# Patient Record
Sex: Male | Born: 1945 | Race: White | Hispanic: No | Smoking: Current every day smoker
Health system: Southern US, Community
[De-identification: ages and names within clinical notes are randomized; demographics above are authoritative.]

## PROBLEM LIST (undated history)

## (undated) DIAGNOSIS — E559 Vitamin D deficiency, unspecified: Secondary | ICD-10-CM

## (undated) DIAGNOSIS — E785 Hyperlipidemia, unspecified: Secondary | ICD-10-CM

## (undated) DIAGNOSIS — E042 Nontoxic multinodular goiter: Secondary | ICD-10-CM

## (undated) DIAGNOSIS — E119 Type 2 diabetes mellitus without complications: Secondary | ICD-10-CM

## (undated) DIAGNOSIS — I2510 Atherosclerotic heart disease of native coronary artery without angina pectoris: Secondary | ICD-10-CM

## (undated) DIAGNOSIS — M109 Gout, unspecified: Secondary | ICD-10-CM

## (undated) DIAGNOSIS — M1990 Unspecified osteoarthritis, unspecified site: Secondary | ICD-10-CM

## (undated) DIAGNOSIS — I10 Essential (primary) hypertension: Secondary | ICD-10-CM

## (undated) DIAGNOSIS — M5416 Radiculopathy, lumbar region: Secondary | ICD-10-CM

## (undated) DIAGNOSIS — L720 Epidermal cyst: Secondary | ICD-10-CM

## (undated) HISTORY — PX: OTHER SURGICAL HISTORY: SHX169

## (undated) HISTORY — PX: TONSILLECTOMY: SUR1361

## (undated) HISTORY — PX: LUMBAR DISC SURGERY: SHX700

## (undated) HISTORY — PX: CARDIAC CATHETERIZATION: SHX172

---

## 1993-06-23 HISTORY — PX: HEMORRHOID SURGERY: SHX153

## 1997-06-23 HISTORY — PX: CORONARY ARTERY BYPASS GRAFT: SHX141

## 1998-05-23 DIAGNOSIS — I219 Acute myocardial infarction, unspecified: Secondary | ICD-10-CM

## 1998-05-23 HISTORY — DX: Acute myocardial infarction, unspecified: I21.9

## 2008-06-30 ENCOUNTER — Ambulatory Visit: Payer: Self-pay | Admitting: Internal Medicine

## 2009-11-12 ENCOUNTER — Ambulatory Visit: Payer: Self-pay | Admitting: Internal Medicine

## 2010-12-07 ENCOUNTER — Inpatient Hospital Stay: Payer: Self-pay | Admitting: Internal Medicine

## 2012-02-25 ENCOUNTER — Ambulatory Visit: Payer: Self-pay | Admitting: Otolaryngology

## 2012-09-29 ENCOUNTER — Ambulatory Visit: Payer: Self-pay | Admitting: Otolaryngology

## 2013-12-01 ENCOUNTER — Ambulatory Visit: Payer: Self-pay | Admitting: Otolaryngology

## 2014-08-25 ENCOUNTER — Encounter: Payer: Self-pay | Admitting: General Surgery

## 2014-08-30 ENCOUNTER — Ambulatory Visit: Payer: Self-pay | Admitting: General Surgery

## 2017-02-14 ENCOUNTER — Encounter: Payer: Self-pay | Admitting: Emergency Medicine

## 2017-02-14 ENCOUNTER — Emergency Department
Admission: EM | Admit: 2017-02-14 | Discharge: 2017-02-14 | Disposition: A | Discharge status: Home / Self Care | Payer: Medicare Other | Attending: Emergency Medicine | Admitting: Emergency Medicine

## 2017-02-14 DIAGNOSIS — I10 Essential (primary) hypertension: Secondary | ICD-10-CM | POA: Diagnosis not present

## 2017-02-14 DIAGNOSIS — F17210 Nicotine dependence, cigarettes, uncomplicated: Secondary | ICD-10-CM | POA: Insufficient documentation

## 2017-02-14 DIAGNOSIS — M5442 Lumbago with sciatica, left side: Secondary | ICD-10-CM | POA: Insufficient documentation

## 2017-02-14 DIAGNOSIS — M545 Low back pain: Secondary | ICD-10-CM | POA: Diagnosis present

## 2017-02-14 HISTORY — DX: Essential (primary) hypertension: I10

## 2017-02-14 MED ORDER — KETOROLAC TROMETHAMINE 30 MG/ML IJ SOLN
30.0000 mg | Freq: Once | INTRAMUSCULAR | Status: AC
  Administered 2017-02-14: 30 mg via INTRAMUSCULAR
  Filled 2017-02-14: qty 1

## 2017-02-14 MED ORDER — PREDNISONE 50 MG PO TABS: ORAL_TABLET | ORAL | 0 refills | Status: DC

## 2017-02-14 MED ORDER — CYCLOBENZAPRINE HCL 5 MG PO TABS: 5.0000 mg | ORAL_TABLET | Freq: Three times a day (TID) | ORAL | 0 refills | Status: AC | PRN

## 2017-02-14 NOTE — ED Notes (Signed)
Discussed discharge instructions, prescriptions, and follow-up care with patient. No questions or concerns at this time. Pt stable at discharge.  

## 2017-02-14 NOTE — ED Provider Notes (Signed)
[facility] Emergency Department Provider Note  ____________________________________________  Time seen: Approximately 3:41 PM  I have reviewed the triage vital signs and the nursing notes.   HISTORY  Chief Complaint Back Pain    HPI Keith Bradley is a 71 y.o. male presents to the emergency department with 9/10 low back pain with left lower extremity radiculopathy for the past 4 days. Patient states that radiculopathy stops at the posterior knee. His pain is worsened with standing and with ambulation and relieved with a sitting position. Patient denies heavy lifting, falls or other incidences of trauma. Patient states that he had similar symptoms approximately 3 years ago which was alleviated with Flexeril and Celebrex. Patient denies changes in sensation or bowel or bladder incontinence.   Past Medical History:  Diagnosis Date  . Hypertension     There are no active problems to display for this patient.   Past Surgical History:  Procedure Laterality Date  . cardiac bypass      Prior to Admission medications   Medication Sig Start Date End Date Taking? Authorizing Provider  cyclobenzaprine (FLEXERIL) 5 MG tablet Take 1 tablet (5 mg total) by mouth 3 (three) times daily as needed for muscle spasms. 02/14/17 02/17/17  Lannie Fields, PA-C  predniSONE (DELTASONE) 50 MG tablet Take 1 tablet by mouth for the next 5 days. 02/14/17   Lannie Fields, PA-C    Allergies Ace inhibitors  No family history on file.  Social History Social History  Substance Use Topics  . Smoking status: Current Every Day Smoker    Packs/day: 0.75    Types: Cigarettes  . Smokeless tobacco: Not on file  . Alcohol use Not on file     Review of Systems  Constitutional: No fever/chills Eyes: No visual changes. No discharge ENT: No upper respiratory complaints. Cardiovascular: no chest pain. Respiratory: no cough. No SOB. Musculoskeletal:Patient has low back pain with left  lower extremity radiculopathy. Skin: Negative for rash, abrasions, lacerations, ecchymosis. Neurological: Negative for headaches, focal weakness or numbness.   ____________________________________________   PHYSICAL EXAM:  VITAL SIGNS: ED Triage Vitals  Enc Vitals Group     BP 02/14/17 1440 (!) 156/70     Pulse Rate 02/14/17 1440 (!) 58     Resp 02/14/17 1440 18     Temp 02/14/17 1440 97.7 F (36.5 C)     Temp Source 02/14/17 1440 Oral     SpO2 02/14/17 1440 96 %     Weight 02/14/17 1440 165 lb (74.8 kg)     Height 02/14/17 1440 5\' 9"  (1.753 m)     Head Circumference --      Peak Flow --      Pain Score 02/14/17 1439 9     Pain Loc --      Pain Edu? --      Excl. in [place]? --      Constitutional: Alert and oriented. Well appearing and in no acute distress. Eyes: Conjunctivae are normal. PERRL. EOMI. Head: Atraumatic. Cardiovascular: Normal rate, regular rhythm. Normal S1 and S2.  Good peripheral circulation. Respiratory: Normal respiratory effort without tachypnea or retractions. Lungs CTAB. Good air entry to the bases with no decreased or absent breath sounds. Musculoskeletal:Patient has 5/5 strength in the upper and lower extremities bilaterally. Full range of motion at the shoulder, elbow and wrist bilaterally. Full range of motion at the hip, knee and ankle bilaterally. Radiculopathy is elicited with standing position. No changes in gait.   Neurologic:  Normal speech and language. No gross focal neurologic deficits are appreciated.  Skin:  Skin is warm, dry and intact. No rash noted. Psychiatric: Mood and affect are normal. Speech and behavior are normal. Patient exhibits appropriate insight and judgement.   ____________________________________________   LABS (all labs ordered are listed, but only abnormal results are displayed)  Labs Reviewed - No data to  display ____________________________________________  EKG   ____________________________________________  RADIOLOGY   No results found.  ____________________________________________    PROCEDURES  Procedure(s) performed:    Procedures    Medications  ketorolac (TORADOL) 30 MG/ML injection 30 mg (not administered)     ____________________________________________   INITIAL IMPRESSION / ASSESSMENT AND PLAN / ED COURSE  Pertinent labs & imaging results that were available during my care of the patient were reviewed by me and considered in my medical decision making (see chart for details).  Review of the [place] CSRS was performed in accordance of the [place] prior to dispensing any controlled drugs.     Assessment and plan Low back pain with left lower extremity radiculopathy Patient presents to the emergency department with low back pain with left lower extremity radiculopathy. Neurologic exam and overall physical exam was reassuring. Toradol was given in the emergency department. Patient was discharged with Flexeril and prednisone. Patient was advised to only start prednisone if radiculopathy is refractory to Toradol. Patient voiced understanding regarding this recommendation. He was referred to neurosurgery, Dr. Cari Caraway. Patient was advised to make a follow-up appointment if low back pain with left lower extremity radiculopathy becomes a chronic issue for him.   ____________________________________________  FINAL CLINICAL IMPRESSION(S) / ED DIAGNOSES  Final diagnoses:  Acute bilateral low back pain with left-sided sciatica      NEW MEDICATIONS STARTED DURING THIS VISIT:  New Prescriptions   CYCLOBENZAPRINE (FLEXERIL) 5 MG TABLET    Take 1 tablet (5 mg total) by mouth 3 (three) times daily as needed for muscle spasms.   PREDNISONE (DELTASONE) 50 MG TABLET    Take 1 tablet by mouth for the next 5 days.        This chart was dictated using voice  recognition software/Dragon. Despite best efforts to proofread, errors can occur which can change the meaning. Any change was purely unintentional.    Lannie Fields, PA-C 02/14/17 1550    Hinda Kehr, MD 02/14/17 2022

## 2017-02-14 NOTE — ED Triage Notes (Signed)
States low back pain radiating to L leg x 5 days. No injury at onset.

## 2017-05-11 ENCOUNTER — Other Ambulatory Visit: Payer: Self-pay | Admitting: Internal Medicine

## 2017-05-11 DIAGNOSIS — M51369 Other intervertebral disc degeneration, lumbar region without mention of lumbar back pain or lower extremity pain: Secondary | ICD-10-CM

## 2017-05-11 DIAGNOSIS — M5136 Other intervertebral disc degeneration, lumbar region: Secondary | ICD-10-CM

## 2017-05-13 ENCOUNTER — Other Ambulatory Visit: Payer: Self-pay | Admitting: Internal Medicine

## 2017-05-13 DIAGNOSIS — N189 Chronic kidney disease, unspecified: Secondary | ICD-10-CM

## 2017-05-20 ENCOUNTER — Ambulatory Visit
Admission: RE | Admit: 2017-05-20 | Discharge: 2017-05-20 | Disposition: A | Discharge status: Home / Self Care | Payer: Medicare Other | Source: Ambulatory Visit | Attending: Internal Medicine | Admitting: Internal Medicine

## 2017-05-20 DIAGNOSIS — N200 Calculus of kidney: Secondary | ICD-10-CM | POA: Insufficient documentation

## 2017-05-20 DIAGNOSIS — N189 Chronic kidney disease, unspecified: Secondary | ICD-10-CM

## 2017-06-08 ENCOUNTER — Ambulatory Visit
Admission: RE | Admit: 2017-06-08 | Discharge: 2017-06-08 | Disposition: A | Discharge status: Home / Self Care | Payer: Medicare Other | Source: Ambulatory Visit | Attending: Internal Medicine | Admitting: Internal Medicine

## 2017-06-08 DIAGNOSIS — M5136 Other intervertebral disc degeneration, lumbar region: Secondary | ICD-10-CM | POA: Diagnosis present

## 2017-06-08 DIAGNOSIS — M48061 Spinal stenosis, lumbar region without neurogenic claudication: Secondary | ICD-10-CM | POA: Diagnosis not present

## 2017-06-08 DIAGNOSIS — M4317 Spondylolisthesis, lumbosacral region: Secondary | ICD-10-CM | POA: Diagnosis not present

## 2017-06-08 DIAGNOSIS — M5126 Other intervertebral disc displacement, lumbar region: Secondary | ICD-10-CM | POA: Insufficient documentation

## 2017-06-08 MED ORDER — GADOBENATE DIMEGLUMINE 529 MG/ML IV SOLN
15.0000 mL | Freq: Once | INTRAVENOUS | Status: AC | PRN
  Administered 2017-06-08: 15 mL via INTRAVENOUS

## 2017-06-10 ENCOUNTER — Ambulatory Visit: Payer: Medicare Other

## 2017-06-23 DIAGNOSIS — N189 Chronic kidney disease, unspecified: Secondary | ICD-10-CM

## 2017-06-23 HISTORY — DX: Chronic kidney disease, unspecified: N18.9

## 2017-07-07 ENCOUNTER — Observation Stay
Admission: EM | Admit: 2017-07-07 | Discharge: 2017-07-09 | Disposition: A | Discharge status: Home / Self Care | Payer: Medicare Other | Attending: Internal Medicine | Admitting: Internal Medicine

## 2017-07-07 ENCOUNTER — Emergency Department: Payer: Medicare Other

## 2017-07-07 ENCOUNTER — Encounter: Payer: Self-pay | Admitting: Intensive Care

## 2017-07-07 DIAGNOSIS — Z7902 Long term (current) use of antithrombotics/antiplatelets: Secondary | ICD-10-CM | POA: Diagnosis not present

## 2017-07-07 DIAGNOSIS — Z951 Presence of aortocoronary bypass graft: Secondary | ICD-10-CM | POA: Insufficient documentation

## 2017-07-07 DIAGNOSIS — Z888 Allergy status to other drugs, medicaments and biological substances status: Secondary | ICD-10-CM | POA: Insufficient documentation

## 2017-07-07 DIAGNOSIS — G8929 Other chronic pain: Principal | ICD-10-CM | POA: Insufficient documentation

## 2017-07-07 DIAGNOSIS — M5459 Other low back pain: Secondary | ICD-10-CM

## 2017-07-07 DIAGNOSIS — M5442 Lumbago with sciatica, left side: Secondary | ICD-10-CM | POA: Insufficient documentation

## 2017-07-07 DIAGNOSIS — F17210 Nicotine dependence, cigarettes, uncomplicated: Secondary | ICD-10-CM | POA: Insufficient documentation

## 2017-07-07 DIAGNOSIS — M549 Dorsalgia, unspecified: Secondary | ICD-10-CM | POA: Diagnosis present

## 2017-07-07 DIAGNOSIS — E781 Pure hyperglyceridemia: Secondary | ICD-10-CM | POA: Diagnosis not present

## 2017-07-07 DIAGNOSIS — I10 Essential (primary) hypertension: Secondary | ICD-10-CM | POA: Insufficient documentation

## 2017-07-07 DIAGNOSIS — M5116 Intervertebral disc disorders with radiculopathy, lumbar region: Secondary | ICD-10-CM | POA: Insufficient documentation

## 2017-07-07 DIAGNOSIS — I2510 Atherosclerotic heart disease of native coronary artery without angina pectoris: Secondary | ICD-10-CM | POA: Diagnosis not present

## 2017-07-07 DIAGNOSIS — M545 Low back pain: Secondary | ICD-10-CM | POA: Diagnosis present

## 2017-07-07 DIAGNOSIS — Z7982 Long term (current) use of aspirin: Secondary | ICD-10-CM | POA: Diagnosis not present

## 2017-07-07 LAB — CBC
HEMATOCRIT: 51 % (ref 40.0–52.0)
HEMOGLOBIN: 17.6 g/dL (ref 13.0–18.0)
MCH: 30.4 pg (ref 26.0–34.0)
MCHC: 34.5 g/dL (ref 32.0–36.0)
MCV: 88.1 fL (ref 80.0–100.0)
Platelets: 272 10*3/uL (ref 150–440)
RBC: 5.79 MIL/uL (ref 4.40–5.90)
RDW: 14.6 % — ABNORMAL HIGH (ref 11.5–14.5)
WBC: 9.7 10*3/uL (ref 3.8–10.6)

## 2017-07-07 LAB — CREATININE, SERUM
CREATININE: 1.22 mg/dL (ref 0.61–1.24)
GFR, EST NON AFRICAN AMERICAN: 58 mL/min — AB (ref >60)

## 2017-07-07 MED ORDER — HYDROMORPHONE HCL 1 MG/ML IJ SOLN
0.5000 mg | Freq: Once | INTRAMUSCULAR | Status: AC
  Administered 2017-07-07: 0.5 mg via INTRAVENOUS
  Filled 2017-07-07: qty 1

## 2017-07-07 MED ORDER — AMLODIPINE BESYLATE 5 MG PO TABS
5.0000 mg | ORAL_TABLET | Freq: Every day | ORAL | Status: DC
  Administered 2017-07-08 – 2017-07-09 (×2): 5 mg via ORAL
  Filled 2017-07-07 (×2): qty 1

## 2017-07-07 MED ORDER — FENOFIBRATE 160 MG PO TABS
160.0000 mg | ORAL_TABLET | Freq: Every day | ORAL | Status: DC
  Administered 2017-07-08 – 2017-07-09 (×2): 160 mg via ORAL
  Filled 2017-07-07 (×2): qty 1

## 2017-07-07 MED ORDER — KETOROLAC TROMETHAMINE 15 MG/ML IJ SOLN
15.0000 mg | Freq: Three times a day (TID) | INTRAMUSCULAR | Status: DC | PRN
  Filled 2017-07-07: qty 1

## 2017-07-07 MED ORDER — ONDANSETRON HCL 4 MG/2ML IJ SOLN: 4.0000 mg | Freq: Four times a day (QID) | INTRAMUSCULAR | Status: DC | PRN

## 2017-07-07 MED ORDER — HYDROMORPHONE HCL 1 MG/ML IJ SOLN: 1.0000 mg | INTRAMUSCULAR | Status: DC | PRN

## 2017-07-07 MED ORDER — ASPIRIN EC 81 MG PO TBEC
81.0000 mg | DELAYED_RELEASE_TABLET | Freq: Every day | ORAL | Status: DC
  Administered 2017-07-08 – 2017-07-09 (×2): 81 mg via ORAL
  Filled 2017-07-07 (×2): qty 1

## 2017-07-07 MED ORDER — PREDNISONE 20 MG PO TABS
60.0000 mg | ORAL_TABLET | Freq: Once | ORAL | Status: AC
  Administered 2017-07-07: 60 mg via ORAL
  Filled 2017-07-07: qty 3

## 2017-07-07 MED ORDER — HYDROMORPHONE HCL 1 MG/ML IJ SOLN
0.5000 mg | Freq: Once | INTRAMUSCULAR | Status: AC
  Administered 2017-07-07: 0.5 mg via INTRAVENOUS

## 2017-07-07 MED ORDER — ACETAMINOPHEN 650 MG RE SUPP: 650.0000 mg | Freq: Four times a day (QID) | RECTAL | Status: DC | PRN

## 2017-07-07 MED ORDER — ONDANSETRON HCL 4 MG PO TABS: 4.0000 mg | ORAL_TABLET | Freq: Four times a day (QID) | ORAL | Status: DC | PRN

## 2017-07-07 MED ORDER — METOPROLOL TARTRATE 50 MG PO TABS
100.0000 mg | ORAL_TABLET | Freq: Two times a day (BID) | ORAL | Status: DC
  Administered 2017-07-08 – 2017-07-09 (×3): 100 mg via ORAL
  Filled 2017-07-07 (×4): qty 2

## 2017-07-07 MED ORDER — CLOPIDOGREL BISULFATE 75 MG PO TABS
75.0000 mg | ORAL_TABLET | Freq: Every day | ORAL | Status: DC
  Administered 2017-07-08: 75 mg via ORAL
  Filled 2017-07-07: qty 1

## 2017-07-07 MED ORDER — ONDANSETRON HCL 4 MG/2ML IJ SOLN
4.0000 mg | Freq: Once | INTRAMUSCULAR | Status: AC
  Administered 2017-07-07: 4 mg via INTRAVENOUS
  Filled 2017-07-07: qty 2

## 2017-07-07 MED ORDER — SENNA 8.6 MG PO TABS
1.0000 | ORAL_TABLET | Freq: Two times a day (BID) | ORAL | Status: DC
  Administered 2017-07-07 – 2017-07-09 (×4): 8.6 mg via ORAL
  Filled 2017-07-07 (×4): qty 1

## 2017-07-07 MED ORDER — ACETAMINOPHEN 325 MG PO TABS: 650.0000 mg | ORAL_TABLET | Freq: Four times a day (QID) | ORAL | Status: DC | PRN

## 2017-07-07 MED ORDER — POLYETHYLENE GLYCOL 3350 17 G PO PACK: 17.0000 g | PACK | Freq: Every day | ORAL | Status: DC | PRN

## 2017-07-07 MED ORDER — ENOXAPARIN SODIUM 40 MG/0.4ML ~~LOC~~ SOLN
40.0000 mg | SUBCUTANEOUS | Status: DC
  Filled 2017-07-07: qty 0.4

## 2017-07-07 MED ORDER — HYDROMORPHONE HCL 1 MG/ML IJ SOLN
1.0000 mg | INTRAMUSCULAR | Status: AC
  Administered 2017-07-07: 1 mg via INTRAVENOUS
  Filled 2017-07-07: qty 1

## 2017-07-07 MED ORDER — LOSARTAN POTASSIUM 25 MG PO TABS
25.0000 mg | ORAL_TABLET | Freq: Every day | ORAL | Status: DC
  Administered 2017-07-08 – 2017-07-09 (×2): 25 mg via ORAL
  Filled 2017-07-07 (×2): qty 1

## 2017-07-07 MED ORDER — OXYCODONE-ACETAMINOPHEN 5-325 MG PO TABS
1.0000 | ORAL_TABLET | Freq: Once | ORAL | Status: AC
  Administered 2017-07-07: 1 via ORAL
  Filled 2017-07-07: qty 1

## 2017-07-07 MED ORDER — GABAPENTIN 100 MG PO CAPS
100.0000 mg | ORAL_CAPSULE | Freq: Three times a day (TID) | ORAL | Status: DC
  Administered 2017-07-08 – 2017-07-09 (×4): 100 mg via ORAL
  Filled 2017-07-07 (×5): qty 1

## 2017-07-07 MED ORDER — OXYCODONE HCL 5 MG PO TABS: 5.0000 mg | ORAL_TABLET | ORAL | Status: DC | PRN

## 2017-07-07 MED ORDER — MORPHINE SULFATE (PF) 4 MG/ML IV SOLN
6.0000 mg | Freq: Once | INTRAVENOUS | Status: AC
  Administered 2017-07-07: 6 mg via INTRAVENOUS
  Filled 2017-07-07: qty 2

## 2017-07-07 NOTE — ED Notes (Signed)
Pt was given water at this time.

## 2017-07-07 NOTE — H&P (Signed)
[facility] [place] NAME: Keith Bradley    MR#:  [id number]  DATE OF BIRTH:  [birth year]  DATE OF ADMISSION:  07/07/2017  PRIMARY CARE PHYSICIAN: Glendon Axe, MD   REQUESTING/REFERRING PHYSICIAN: Dr. Jacqualine Code  CHIEF COMPLAINT:   Severe back pain radiating to the left leg HISTORY OF PRESENT ILLNESS:  Keith Bradley  is a 72 y.o. 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  Patient says he has been on and off having back pain and recently seen a chiropractor a few times which helped however the last 2 times he tried to reach down to put things in the cabinet and jabbed his back requiring him now to come to the emergency room given radiation to the left side.  Denies any bowel or urinary incontinence He got couple rounds of IV pain medication.  ER physician spoke with Dr. Cari Caraway neurosurgery recommends patient be admitted for further evaluation and management.  PAST MEDICAL HISTORY:   Past Medical History:  Diagnosis Date  . Hypertension     PAST SURGICAL HISTOIRY:   Past Surgical History:  Procedure Laterality Date  . cardiac bypass      SOCIAL HISTORY:   Social History   Tobacco Use  . Smoking status: Current Every Day Smoker    Packs/day: 0.75    Types: Cigarettes  . Smokeless tobacco: Never Used  Substance Use Topics  . Alcohol use: No    Frequency: Never    FAMILY HISTORY:  History reviewed. No pertinent family history.  DRUG ALLERGIES:   Allergies  Allergen Reactions  . Ace Inhibitors Swelling    REVIEW OF SYSTEMS:  Review of Systems  Constitutional: Negative for chills, fever and weight loss.  HENT: Negative for  ear discharge, ear pain and nosebleeds.   Eyes: Negative for blurred vision, pain and discharge.  Respiratory: Negative for sputum production, shortness of breath, wheezing and stridor.   Cardiovascular: Negative for chest pain, palpitations, orthopnea and PND.  Gastrointestinal: Negative for abdominal pain, diarrhea, nausea and vomiting.  Genitourinary: Negative for frequency and urgency.  Musculoskeletal: Positive for back pain. Negative for joint pain.  Neurological: Positive for weakness. Negative for sensory change, speech change and focal weakness.  Psychiatric/Behavioral: Negative for depression and hallucinations. The patient is not nervous/anxious.      MEDICATIONS AT HOME:   Prior to Admission medications   Medication Sig Start Date End Date Taking? Authorizing Provider  amLODipine (NORVASC) 5 MG tablet Take 5 mg by mouth daily.   Yes [provider]  aspirin EC 81 MG tablet Take 81 mg by mouth daily.   Yes [provider]  clopidogrel (PLAVIX) 75 MG tablet Take 75 mg by mouth daily.   Yes [provider]  Evolocumab (REPATHA) 140 MG/ML SOSY Inject 140 mg into the skin every 14 (fourteen) days.   Yes [provider]  fenofibrate 160 MG tablet Take 160 mg by mouth daily.   Yes [provider]  gabapentin (NEURONTIN) 100 MG capsule Take 100 mg by mouth 3 (three) times daily.   Yes [provider]  losartan (COZAAR) 25 MG tablet Take 25 mg  by mouth daily.   Yes [provider]  metoprolol tartrate (LOPRESSOR) 100 MG tablet Take 100 mg by mouth 2 (two) times daily.   Yes [provider]      VITAL SIGNS:  Blood pressure (!) 136/59, pulse (!) 57, temperature 97.7 F (36.5 C), temperature source Oral, resp. rate 16, SpO2 93 %.  PHYSICAL EXAMINATION:  GENERAL:  72 y.o.-year-old patient lying in the bed with no acute distress.  EYES: Pupils equal, round, reactive to light and accommodation. No scleral icterus.  Extraocular muscles intact.  HEENT: Head atraumatic, normocephalic. Oropharynx and nasopharynx clear.  NECK:  Supple, no jugular venous distention. No thyroid enlargement, no tenderness.  LUNGS: Normal breath sounds bilaterally, no wheezing, rales,rhonchi or crepitation. No use of accessory muscles of respiration.  CARDIOVASCULAR: S1, S2 normal. No murmurs, rubs, or gallops.  ABDOMEN: Soft, nontender, nondistended. Bowel sounds present. No organomegaly or mass.  EXTREMITIES: No pedal edema, cyanosis, or clubbing.  NEUROLOGIC: Cranial nerves II through XII are intact. Muscle strength 5/5 in all extremities. Sensation intact. Gait not checked.  PSYCHIATRIC: The patient is alert and oriented x 3.  SKIN: No obvious rash, lesion, or ulcer.   LABORATORY PANEL:   CBC No results for input(s): WBC, HGB, HCT, PLT in the last 168 hours. ------------------------------------------------------------------------------------------------------------------  Chemistries  No results for input(s): NA, K, CL, CO2, GLUCOSE, BUN, CREATININE, CALCIUM, MG, AST, ALT, ALKPHOS, BILITOT in the last 168 hours.  Invalid input(s): GFRCGP ------------------------------------------------------------------------------------------------------------------  Cardiac Enzymes No results for input(s): TROPONINI in the last 168 hours. ------------------------------------------------------------------------------------------------------------------  RADIOLOGY:  Mr Lumbar Spine Wo Contrast  Result Date: 07/07/2017 CLINICAL DATA:  Low back pain radiating to the left lower extremity EXAM: MRI LUMBAR SPINE WITHOUT CONTRAST TECHNIQUE: Multiplanar, multisequence MR imaging of the lumbar spine was performed. No intravenous contrast was administered. COMPARISON:  Lumbar spine MRI 06/08/2017 FINDINGS: Segmentation:  Standard. Alignment:  Physiologic. Vertebrae: Heterogeneous bone marrow signal, unchanged. No acute fracture or  discitis-osteomyelitis. Conus medullaris and cauda equina: Conus extends to the L1 level. Conus and cauda equina appear normal. Paraspinal and other soft tissues: Normal Disc levels: T11-T12: Evaluated on sagittal images only.  No stenosis. T12-L1: Normal disc space and facets. No spinal canal or neuroforaminal stenosis. L1-L2: Normal disc space and facets. No spinal canal or neuroforaminal stenosis. L2-L3: Disc desiccation and mild diffuse bulge. No spinal canal stenosis. Mild bilateral foraminal stenosis. L3-L4: Left eccentric disc bulge, greatest in the left extraforaminal zone, unchanged. Mild right and moderate left foraminal stenosis. No spinal canal stenosis. Normal facets. L4-L5: Disc desiccation with left eccentric bulge and extraforaminal left annular fissure. No spinal canal stenosis or neural foraminal stenosis. The left extraforaminal annular fissure is in close proximity to the exiting L4 nerve root, but unchanged from the prior study. Moderate left facet hypertrophy. L5-S1: Bilobed subarticular/foraminal disc protrusions with moderate, unchanged bilateral neural foraminal stenosis. No spinal canal stenosis. Visualized sacrum: Normal. IMPRESSION: 1. L3-L4 and L4-L5 left eccentric disc bulges that are greatest in the extraforaminal zone and are in close proximity to the exiting left L3 and L4 nerve roots, but are unchanged compared to 06/08/2017. Moderate left L3 foraminal stenosis. 2. L5-S1 moderate bilateral foraminal stenosis. Electronically Signed   By: Ulyses Jarred M.D.   On: 07/07/2017 15:25    EKG:    IMPRESSION AND PLAN:   Keith Bradley  is a 72 y.o. 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  1.  Acute on chronic low back pain -MRI indicated of [id number] disc bulges with left L3 foraminal stenosis -Admit for pain control and neurosurgery eval -IV Dilaudid as needed with Tylenol and oxycodone -Continue Neurontin -Received 1 dose of prednisone in the ER.  I will hold off until neuro evaluation is done to see if patient needs a course of prednisone -Therapy consultation once seen by neurosurgery  2.  Hypertension continue losartan and metoprolol  3.  CAD status post CABG -Continue cardiac meds and aspirin Plavix  4.  DVT prophylaxis subcu Lovenox    All the records are reviewed and case discussed with ED provider. Management plans discussed with the patient, family and they are in agreement.  CODE STATUS: full  TOTAL TIME TAKING CARE OF THIS PATIENT: *50* minutes.    [name] M.D on 07/07/2017 at 6:44 PM  Between 7am to 6pm - Pager - [phone]  After 6pm go to [email or web address] - password [email or web address] [facility]  [facility]  Office  [phone]  CC: Primary care physician; Glendon Axe, MD

## 2017-07-07 NOTE — ED Notes (Signed)
Patient transported to MRI 

## 2017-07-07 NOTE — ED Provider Notes (Signed)
[facility] Emergency Department Provider Note  ____________________________________________   First MD Initiated Contact with Patient 07/07/17 1027     (approximate)  I have reviewed the triage vital signs and the nursing notes.   HISTORY  Chief Complaint Back Pain (lower)   HPI Keith Bradley is a 72 y.o. male with worsening back pain over the past month who is presenting to the emergency department today with left lower back pain radiating down his left leg.  He is denying any numbness, weakness or loss of bowel or bladder continence.  Michela Pitcher that he had a recent MRI which showed herniated disks.  He has been taking Tylenol as well as gabapentin without improvement.  He is presenting to the emergency department today because of worsening pain as well as not being able to do some activities anymore such as putting dishes and is covered as well as lifting grocery bags up his stairs.  He states his pain is an 8-10 out of 10 at this time and radiating down his left leg, laterally down to the knee.    Past Medical History:  Diagnosis Date  . Hypertension     There are no active problems to display for this patient.   Past Surgical History:  Procedure Laterality Date  . cardiac bypass      Prior to Admission medications   Medication Sig Start Date End Date Taking? Authorizing Provider  predniSONE (DELTASONE) 50 MG tablet Take 1 tablet by mouth for the next 5 days. 02/14/17   Lannie Fields, PA-C    Allergies Ace inhibitors  History reviewed. No pertinent family history.  Social History Social History   Tobacco Use  . Smoking status: Current Every Day Smoker    Packs/day: 0.75    Types: Cigarettes  . Smokeless tobacco: Never Used  Substance Use Topics  . Alcohol use: No    Frequency: Never  . Drug use: Not on file    Review of Systems  Constitutional: No fever/chills Eyes: No visual changes. ENT: No sore throat. Cardiovascular: Denies  chest pain. Respiratory: Denies shortness of breath. Gastrointestinal: No abdominal pain.  No nausea, no vomiting.  No diarrhea.  No constipation. Genitourinary: Negative for dysuria. Musculoskeletal: As above  skin: Negative for rash. Neurological: Negative for headaches, focal weakness or numbness.   ____________________________________________   PHYSICAL EXAM:  VITAL SIGNS: ED Triage Vitals [07/07/17 1024]  Enc Vitals Group     BP (!) 187/78     Pulse Rate (!) 56     Resp 16     Temp 97.9 F (36.6 C)     Temp Source Oral     SpO2 95 %     Weight      Height      Head Circumference      Peak Flow      Pain Score 7     Pain Loc      Pain Edu?      Excl. in [place]?     Constitutional: Alert and oriented. Well appearing and in no acute distress. Eyes: Conjunctivae are normal.  Head: Atraumatic. Nose: No congestion/rhinnorhea. Mouth/Throat: Mucous membranes are moist.  Neck: No stridor.   Cardiovascular: Normal rate, regular rhythm. Grossly normal heart sounds.  Good peripheral circulation with equal and bilateral dorsalis pedis pulses. Respiratory: Normal respiratory effort.  No retractions. Lungs CTAB. Gastrointestinal: Soft and nontender. No distention. No CVA tenderness. Musculoskeletal: No lower extremity tenderness nor edema.  No joint effusions.  5 out of 5 strength bilateral lower extreme knees.  No tenderness to palpation to the bilateral lower extreme is.  There is minimal tenderness palpation of the left low lumbar region.  No midline tender to the mid no saddle anesthesia.  5 out of 5 strength to dorsiflexion of the great toes bilaterally. Neurologic:  Normal speech and language. No gross focal neurologic deficits are appreciated. Skin:  Skin is warm, dry and intact. No rash noted. Psychiatric: Mood and affect are normal. Speech and behavior are normal.  ____________________________________________   LABS (all labs ordered are listed, but only abnormal results  are displayed)  Labs Reviewed - No data to display ____________________________________________  EKG   ____________________________________________  RADIOLOGY   ____________________________________________   PROCEDURES  Procedure(s) performed:   Procedures  Critical Care performed:   ____________________________________________   INITIAL IMPRESSION / ASSESSMENT AND PLAN / ED COURSE  Pertinent labs & imaging results that were available during my care of the patient were reviewed by me and considered in my medical decision making (see chart for details).  DDX: Radiculopathy, sciatica, chronic low back pain, vertebral compression fracture, cauda equina As part of my medical decision making, I reviewed the following data within the [place] reviewed from 1217 which shows protrusion in the far periphery of the left foramen at L3 and L5 which extends beyond the foramen and impinges on the exiting and exited left L3 root.  Moderate to severe foraminal stenosis.  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  We will progress his pain control and try with the Percocet as well as prednisone and reassess.   ----------------------------------------- 3:25 PM on 07/07/2017 -----------------------------------------  Patient without resolution of pain despite Percocet, prednisone, 8 mg of morphine and Dilaudid.  Pending MRI at this time.  Signed out to Dr. Jacqualine Code.  ____________________________________________   FINAL CLINICAL IMPRESSION(S) / ED DIAGNOSES  Low back pain with sciatica.    NEW MEDICATIONS STARTED DURING THIS VISIT:  New Prescriptions   No medications on file     Note:  This document was prepared using Dragon voice recognition software and may include unintentional dictation errors.     Orbie Pyo, MD 07/07/17  [phone]

## 2017-07-07 NOTE — ED Triage Notes (Signed)
Patient arrived from home by EMS for lower back pain that radiates down L leg. Pt states PCP [facility] told him he has sciatica and bulging disc in L3 and L1 out of line shown by MRI. A&O x4

## 2017-07-07 NOTE — Progress Notes (Signed)
Family Meeting Note  Advance Directive:yes  Today a meeting took place with the  Pt anf MD   The following were discussed:Patient's diagnosis: , Patient's progosis: Patient comes in with intractable back pain.  He has lumbar disc disease.  He has a history of CAD and hypertension.  Discuss CODE STATUS patient wants to be full code.   Time spent during discussion 16 mins [name], MD

## 2017-07-07 NOTE — ED Provider Notes (Signed)
Mr Lumbar Spine Wo Contrast  Result Date: 07/07/2017 CLINICAL DATA:  Low back pain radiating to the left lower extremity EXAM: MRI LUMBAR SPINE WITHOUT CONTRAST TECHNIQUE: Multiplanar, multisequence MR imaging of the lumbar spine was performed. No intravenous contrast was administered. COMPARISON:  Lumbar spine MRI 06/08/2017 FINDINGS: Segmentation:  Standard. Alignment:  Physiologic. Vertebrae: Heterogeneous bone marrow signal, unchanged. No acute fracture or discitis-osteomyelitis. Conus medullaris and cauda equina: Conus extends to the L1 level. Conus and cauda equina appear normal. Paraspinal and other soft tissues: Normal Disc levels: T11-T12: Evaluated on sagittal images only.  No stenosis. T12-L1: Normal disc space and facets. No spinal canal or neuroforaminal stenosis. L1-L2: Normal disc space and facets. No spinal canal or neuroforaminal stenosis. L2-L3: Disc desiccation and mild diffuse bulge. No spinal canal stenosis. Mild bilateral foraminal stenosis. L3-L4: Left eccentric disc bulge, greatest in the left extraforaminal zone, unchanged. Mild right and moderate left foraminal stenosis. No spinal canal stenosis. Normal facets. L4-L5: Disc desiccation with left eccentric bulge and extraforaminal left annular fissure. No spinal canal stenosis or neural foraminal stenosis. The left extraforaminal annular fissure is in close proximity to the exiting L4 nerve root, but unchanged from the prior study. Moderate left facet hypertrophy. L5-S1: Bilobed subarticular/foraminal disc protrusions with moderate, unchanged bilateral neural foraminal stenosis. No spinal canal stenosis. Visualized sacrum: Normal. IMPRESSION: 1. L3-L4 and L4-L5 left eccentric disc bulges that are greatest in the extraforaminal zone and are in close proximity to the exiting left L3 and L4 nerve roots, but are unchanged compared to 06/08/2017. Moderate left L3 foraminal stenosis. 2. L5-S1 moderate bilateral foraminal stenosis. Electronically  Signed   By: Ulyses Jarred M.D.   On: 07/07/2017 15:25    MRI reviewed, appears stable from previous.  Patient reports his pain is recurring and severe.  He is awake and alert and appears in pain, reports he cannot lay at all in his left side he is having severe radiating pain in the left posterior leg.  We will give additional Dilaudid, as discussed with Dr. Cari Caraway of neurosurgery, he is happy to see the patient in consultation in the hospital and advising pain control.  I discussed with the patient, patient is agreeable and requesting admission to hospital for pain control.  At this point given the high doses of IV narcotics required to even ease his pain back some I think it is reasonable to admit him for neurosurgery consultation.  No evidence of acute cauda equina.  Plan consult with neurosurgery today.  Hospitalist to admit.  Discussed case with Dr. Lovett Sox, MD 07/07/17 [phone]

## 2017-07-07 NOTE — Progress Notes (Signed)
Patient admitted to room 138. Alert and oriented. Patient was able to roll himself from the stretcher to the bed unassisted. No complaints of numbness or tingling. Patient able to move lower extremities unassisted.

## 2017-07-07 NOTE — Consult Note (Signed)
Full consult note to follow.  72 yo male with L thigh pain and L3 greater than L4 compression on lumbar MRI scan.    - agree with prednisone and other methods for pain control - ideally I would recommend that he have L L3 and/or L4 nerve blocks by IR, but they may be hesitant to do so because of his history of being on plavix - He may improve with conservative measures including meds, physical therapy, and injections.  If he does not, surgical intervention could be considered.    Keith Bradley

## 2017-07-08 ENCOUNTER — Other Ambulatory Visit: Payer: Self-pay

## 2017-07-08 DIAGNOSIS — G8929 Other chronic pain: Secondary | ICD-10-CM | POA: Diagnosis not present

## 2017-07-08 MED ORDER — CLOPIDOGREL BISULFATE 75 MG PO TABS
75.0000 mg | ORAL_TABLET | Freq: Every day | ORAL | Status: DC
  Filled 2017-07-08: qty 1

## 2017-07-08 MED ORDER — PREDNISONE 50 MG PO TABS
50.0000 mg | ORAL_TABLET | Freq: Two times a day (BID) | ORAL | Status: DC
  Administered 2017-07-08 – 2017-07-09 (×2): 50 mg via ORAL
  Filled 2017-07-08 (×2): qty 1

## 2017-07-08 NOTE — Care Management Obs Status (Signed)
[facility] NOTIFICATION   Patient Details  Name: Lani Mendiola MRN: [id number] Date of Birth: 1946-01-10   Medicare Observation Status Notification Given:  Yes    Jolly Mango, RN 07/08/2017, 11:15 AM

## 2017-07-08 NOTE — Consult Note (Signed)
Referring Physician:  No referring provider defined for this encounter.  Primary Physician:  Glendon Axe, MD  Chief Complaint:  L Leg pain  History of Present Illness: 07/08/2017 Keith Bradley is a 72 y.o. male who presents with the chief complaint of L leg pain.  Duration: 2 months Location: L lower back to lateral and anterior thigh stopping at the knee Quality: stabbing Provoking: aggravated by standing straight up Alleviating: made better by laying down in recumbent position Weakness: none Timing: whenever he walks Bowel/Bladder: no dysfunction  [name] has no symptoms of cervical myelopathy.  Conservative measures: Physical therapy: none Medications: has tried narcotics without improvement Injections: none  The symptoms are causing a significant impact on the patient's life.   Review of Systems:  A 10 point review of systems is negative, except for the pertinent positives and negatives detailed in the HPI.  Past Medical History: Past Medical History:  Diagnosis Date  . Hypertension     Past Surgical History: Past Surgical History:  Procedure Laterality Date  . cardiac bypass      Allergies: Allergies as of 07/07/2017 - Review Complete 07/07/2017  Allergen Reaction Noted  . Ace inhibitors Swelling 02/14/2017    Medications:  Current Facility-Administered Medications:  .  acetaminophen (TYLENOL) tablet 650 mg, 650 mg, Oral, Q6H PRN **OR** acetaminophen (TYLENOL) suppository 650 mg, 650 mg, Rectal, Q6H PRN, [name], MD .  amLODipine (NORVASC) tablet 5 mg, 5 mg, Oral, Daily, [name], MD, 5 mg at 07/08/17 1324 .  aspirin EC tablet 81 mg, 81 mg, Oral, Daily, [name], MD, 81 mg at 07/08/17 4010 .  enoxaparin (LOVENOX) injection 40 mg, 40 mg, Subcutaneous, Q24H, Posey Pronto, Sona, MD .  fenofibrate tablet 160 mg, 160 mg, Oral, Daily, [name], MD, 160 mg at 07/08/17 0938 .  gabapentin (NEURONTIN) capsule 100 mg, 100 mg, Oral, TID, [name], MD, 100 mg at 07/08/17 0938 .  HYDROmorphone (DILAUDID) injection 1 mg, 1 mg, Intravenous, Q4H PRN, [name], MD .  ketorolac (TORADOL) 15 MG/ML injection 15 mg, 15 mg, Intravenous, Q8H PRN, [name], MD .  losartan (COZAAR) tablet 25 mg, 25 mg, Oral, Daily, [name], MD, 25 mg at 07/08/17 0938 .  metoprolol tartrate (LOPRESSOR) tablet 100 mg, 100 mg, Oral, BID, [name], MD, 100 mg at 07/08/17 0938 .  ondansetron (ZOFRAN) tablet 4 mg, 4 mg, Oral, Q6H PRN **OR** ondansetron (ZOFRAN) injection 4 mg, 4 mg, Intravenous, Q6H PRN, [name], MD .  oxyCODONE (Oxy IR/ROXICODONE) immediate release tablet 5 mg, 5 mg, Oral, Q4H PRN, [name], MD .  polyethylene glycol (MIRALAX / GLYCOLAX) packet 17 g, 17 g, Oral, Daily PRN, [name], MD .  predniSONE (DELTASONE) tablet 50 mg, 50 mg, Oral, BID WC, Gouru, Aruna, MD .  senna (SENOKOT) tablet 8.6 mg, 1 tablet, Oral, BID, [name], MD, 8.6 mg at 07/08/17 2725   Social History: Social History   Tobacco Use  . Smoking status: Current Every Day Smoker    Packs/day: 0.75    Types: Cigarettes  . Smokeless tobacco: Never Used  Substance Use Topics  . Alcohol use: No    Frequency: Never  . Drug use: Not on file    Family Medical History: History reviewed. No pertinent family history.  Physical Examination: Vitals:   07/08/17 0411 07/08/17 0414  BP: (!) 128/51   Pulse: (!) 47 (!) 50  Resp: 18   Temp: 97.8 F (36.6 C)   SpO2: 96%  96%     General: Patient is well developed, well nourished, calm, collected, and in no apparent distress.  Psychiatric: Patient is non-anxious.  Head:  Pupils equal, round, and reactive to light.  ENT:  Oral mucosa appears well hydrated.  Neck:   Supple.  Full range of motion.  Respiratory: Patient is breathing without any difficulty.  Extremities: No edema.  Vascular: Palpable pulses in dorsal pedal vessels.  Skin:   On exposed skin, there are no abnormal skin  lesions.  NEUROLOGICAL:  General: In no acute distress.   Awake, alert, oriented to person, place, and time.  Pupils equal round and reactive to light.  Facial tone is symmetric.  Tongue protrusion is midline.  There is no pronator drift.  ROM of spine: guards himself in extension.  Palpation of spine: nontender.    Strength: Side Biceps Triceps Deltoid Interossei Grip Wrist Ext. Wrist Flex.  R 5 5 5 5 5 5 5   L 5 5 5 5 5 5 5    Side Iliopsoas Quads Hamstring PF DF EHL  R 5 5 5 5 5 5   L 5 5 5 5 5 5    Reflexes are 1+ and symmetric at the biceps, triceps, brachioradialis, patella and achilles.   Bilateral upper and lower extremity sensation is intact to light touch and pin prick.  Clonus is not present.  Toes are down-going.  Gait is abnormal - unable to perform without bending over.  Hoffman's is absent.  Imaging: MRI L spine 07/07/2017 IMPRESSION: 1. L3-L4 and L4-L5 left eccentric disc bulges that are greatest in the extraforaminal zone and are in close proximity to the exiting left L3 and L4 nerve roots, but are unchanged compared to 06/08/2017. Moderate left L3 foraminal stenosis. 2. L5-S1 moderate bilateral foraminal stenosis.   Electronically Signed   By: Ulyses Jarred M.D.   On: 07/07/2017 15:25  I have personally reviewed the images and agree with the above interpretation.  Assessment and Plan: [name] is a pleasant 72 y.o. 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  The L3 nerve root seems more likely given the level of compression.  - Would pursue medical management including gabapentin, pain meds, and steroid taper - I will contact Dr. Sharlet Salina or Dr. Holley Raring to try to help arrange an injection - Would try to work with PT if possible, and send him with outpatient appointment. - I will see him in clinic 2 weeks after his injection. [phone] for appointment     Ardyth Gal K. Izora Ribas MD, [place] [facility]

## 2017-07-08 NOTE — Progress Notes (Signed)
Patient alert and oriented, vss.  No pain on dayshift.  Ambulates to bathroom with no assistance.  No acute events during shift.  Continue to monitor.

## 2017-07-08 NOTE — Progress Notes (Signed)
[facility] NAME: Keith Bradley    MR#:  [id number]  DATE OF BIRTH:  July 31, 1945  SUBJECTIVE:  CHIEF COMPLAINT: Patient is reporting back pain is 3 out of 10 while resting but is going up to 9-10 out of 10 with any kind of movements.  He would like to talk to the neurosurgery and wants to get the  epidural injection in the back for better pain control.   REVIEW OF SYSTEMS:  CONSTITUTIONAL: No fever, fatigue or weakness.  EYES: No blurred or double vision.  EARS, NOSE, AND THROAT: No tinnitus or ear pain.  RESPIRATORY: No cough, shortness of breath, wheezing or hemoptysis.  CARDIOVASCULAR: No chest pain, orthopnea, edema.  GASTROINTESTINAL: No nausea, vomiting, diarrhea or abdominal pain.  GENITOURINARY: No dysuria, hematuria.  ENDOCRINE: No polyuria, nocturia,  HEMATOLOGY: No anemia, easy bruising or bleeding SKIN: No rash or lesion. MUSCULOSKELETAL: Reporting low back pain which is getting worse with any kind of movements NEUROLOGIC: No tingling, numbness, weakness.  PSYCHIATRY: No anxiety or depression.   DRUG ALLERGIES:   Allergies  Allergen Reactions  . Ace Inhibitors Swelling    VITALS:  Blood pressure (!) 128/51, pulse (!) 50, temperature 97.8 F (36.6 C), temperature source Oral, resp. rate 18, height 5\' 9"  (1.753 m), weight 83.2 kg (183 lb 8 oz), SpO2 96 %.  PHYSICAL EXAMINATION:  GENERAL:  72 y.o.-year-old patient lying in the bed with no acute distress.  EYES: Pupils equal, round, reactive to light and accommodation. No scleral icterus. Extraocular muscles intact.  HEENT: Head atraumatic, normocephalic. Oropharynx and nasopharynx clear.  NECK:  Supple, no jugular venous distention. No thyroid enlargement, no tenderness.  LUNGS: Normal breath sounds bilaterally, no wheezing, rales,rhonchi or crepitation. No use of accessory muscles of respiration.  CARDIOVASCULAR: S1, S2 normal. No murmurs, rubs, or gallops.   ABDOMEN: Soft, nontender, nondistended. Bowel sounds present. No organomegaly or mass.  EXTREMITIES: Lower back is diffusely tender no pedal edema, cyanosis, or clubbing.  NEUROLOGIC: Cranial nerves II through XII are intact. Sensation intact. Gait not checked.  PSYCHIATRIC: The patient is alert and oriented x 3.  SKIN: No obvious rash, lesion, or ulcer.    LABORATORY PANEL:   CBC Recent Labs  Lab 07/07/17 1936  WBC 9.7  HGB 17.6  HCT 51.0  PLT 272   ------------------------------------------------------------------------------------------------------------------  Chemistries  Recent Labs  Lab 07/07/17 1936  CREATININE 1.22   ------------------------------------------------------------------------------------------------------------------  Cardiac Enzymes No results for input(s): TROPONINI in the last 168 hours. ------------------------------------------------------------------------------------------------------------------  RADIOLOGY:  Mr Lumbar Spine Wo Contrast  Result Date: 07/07/2017 CLINICAL DATA:  Low back pain radiating to the left lower extremity EXAM: MRI LUMBAR SPINE WITHOUT CONTRAST TECHNIQUE: Multiplanar, multisequence MR imaging of the lumbar spine was performed. No intravenous contrast was administered. COMPARISON:  Lumbar spine MRI 06/08/2017 FINDINGS: Segmentation:  Standard. Alignment:  Physiologic. Vertebrae: Heterogeneous bone marrow signal, unchanged. No acute fracture or discitis-osteomyelitis. Conus medullaris and cauda equina: Conus extends to the L1 level. Conus and cauda equina appear normal. Paraspinal and other soft tissues: Normal Disc levels: T11-T12: Evaluated on sagittal images only.  No stenosis. T12-L1: Normal disc space and facets. No spinal canal or neuroforaminal stenosis. L1-L2: Normal disc space and facets. No spinal canal or neuroforaminal stenosis. L2-L3: Disc desiccation and mild diffuse bulge. No spinal canal stenosis. Mild bilateral  foraminal stenosis. L3-L4: Left eccentric disc bulge, greatest in the left extraforaminal zone, unchanged. Mild right and moderate left foraminal stenosis.  No spinal canal stenosis. Normal facets. L4-L5: Disc desiccation with left eccentric bulge and extraforaminal left annular fissure. No spinal canal stenosis or neural foraminal stenosis. The left extraforaminal annular fissure is in close proximity to the exiting L4 nerve root, but unchanged from the prior study. Moderate left facet hypertrophy. L5-S1: Bilobed subarticular/foraminal disc protrusions with moderate, unchanged bilateral neural foraminal stenosis. No spinal canal stenosis. Visualized sacrum: Normal. IMPRESSION: 1. L3-L4 and L4-L5 left eccentric disc bulges that are greatest in the extraforaminal zone and are in close proximity to the exiting left L3 and L4 nerve roots, but are unchanged compared to 06/08/2017. Moderate left L3 foraminal stenosis. 2. L5-S1 moderate bilateral foraminal stenosis. Electronically Signed   By: Ulyses Jarred M.D.   On: 07/07/2017 15:25    EKG:  No orders found for this or any previous visit.  ASSESSMENT AND PLAN:    Keith Bradley  is a 72 y.o. 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  1.  Acute on chronic low back pain -MRI indicated of [id number] disc bulges with left L3 foraminal stenosis -con pain control and neurosurgery eval -Discussed with neurosurgery Dr. Cari Caraway, agreeable to discontinue Plavix for possible epidural injections he is going to discuss with anesthesiology regarding this  -IV Dilaudid as needed with Tylenol and oxycodone -Prednisone 50 mg twice a day and taper slowly -Continue  Neurontin -Therapy consultation  if okay with neurosurgery  2.  Hypertension continue losartan and metoprolol  3.  CAD status post CABG -Continue cardiac meds and aspirin Holding Plavix for anticipated epidural injection for acute and intractable low back pain  4.  DVT prophylaxis subcu Lovenox      All the records are reviewed and case discussed with Care Management/Social Workerr. Management plans discussed with the patient, family and they are in agreement.  CODE STATUS:  fc   TOTAL TIME TAKING CARE OF THIS PATIENT:  36  minutes.   POSSIBLE D/C IN 1-2  DAYS, DEPENDING ON CLINICAL CONDITION.  Note: This dictation was prepared with Dragon dictation along with smaller phrase technology. Any transcriptional errors that result from this process are unintentional.   Nicholes Mango M.D on 07/08/2017 at 2:34 PM  Between 7am to 6pm - Pager - [phone] After 6pm go to [email or web address] - password [email or web address] [facility]  [facility]  Office  [phone]  CC: Primary care physician; Glendon Axe, MD

## 2017-07-09 DIAGNOSIS — G8929 Other chronic pain: Secondary | ICD-10-CM | POA: Diagnosis not present

## 2017-07-09 MED ORDER — PREDNISONE 10 MG PO TABS: ORAL_TABLET | ORAL | 0 refills | Status: AC

## 2017-07-09 NOTE — Progress Notes (Signed)
Patient is being discharged home with family. IV removed with cath intact.  Reviewed meds, scripts, and last dose taken.  Allowed time for questions.

## 2017-07-09 NOTE — Care Management (Signed)
Patient not currently in room. Per physical therapy, patient prefers [facility] outpatient PT. Order faxed down to [facility] OP PT.  Will update patient when he returns

## 2017-07-09 NOTE — Evaluation (Signed)
Physical Therapy Evaluation Patient Details Name: Keith Bradley MRN: [id number] DOB: [birth year] Today's Date: 07/09/2017   History of Present Illness  Pt admitted for LBP related to L3/L4 bulging disc and foraminal stenosis.  PMH includes Htn.  Clinical Impression  Pt is a 72 year old male who lives alone in an apartment.  Pt presents with kyphotic posture and a forward flexed trunk consistent with a dx of stenosis.  Pt reports 0/10 pain with rest and consistent increase in pain with standing.  Pt responded to repeated extension and flexion with pain increase.  Pt overall strength is 4+/5 with the exception of knee flexion (4/5) and L hip IR (4-/5 with pain).  Pt ambulated 28 ft with SPC following pt education concerning proper use.  Stair negotiation was not performed due to pt's limited ambulatory distance.  Pt reported no N/T in LE.  PT issued and Hep with focus on core strengthening.  Pt will continue to benefit from skilled PT with focus on strength, pain management, safe use of AD, postural and stair training.    Follow Up Recommendations Outpatient PT(Pt is interested in [facility] outpt and will need valet services as well as a WC escort due to decreased ambulatory distance.)    Equipment Recommendations  Rolling walker with 5" wheels    Recommendations for Other Services       Precautions / Restrictions Precautions Precautions: Fall Precaution Comments: MFR Restrictions Weight Bearing Restrictions: No      Mobility  Bed Mobility Overal bed mobility: Independent                Transfers Overall transfer level: Independent Equipment used: None             General transfer comment: Pt uses BUE for support during STS transfer and does not require increased time to do so.  Pt's pain is usually not present at this time due to pain subsiding when the pt is at rest.  Ambulation/Gait Ambulation/Gait assistance: Supervision Ambulation Distance (Feet): 80 Feet Assistive  device: Straight cane Gait Pattern/deviations: Antalgic;Trunk flexed   Gait velocity interpretation: Below normal speed for age/gender General Gait Details: Pt demonstrated a slow two point gait pattern with use of SPC on R side.  Pt reported gradual pain increase from time that he first stood until he reached 40 ft of ambulation, requesting to return to his room and sit down. Pt states that this is the reason that he decided to seek medical help for his back pain.  This is not his baseline function.  Stairs            Wheelchair Mobility    Modified Rankin (Stroke Patients Only)       Balance Overall balance assessment: Modified Independent                                           Pertinent Vitals/Pain Pain Assessment: 0-10 Pain Score: 1  Pain Location: L low back and buttock with most movement. Pain Descriptors / Indicators: Aching Pain Intervention(s): Limited activity within patient's tolerance    Home Living Family/patient expects to be discharged to:: Private residence Living Arrangements: Alone Available Help at Discharge: Neighbor Type of Home: Apartment Home Access: Stairs to enter Entrance Stairs-Rails: Can reach both Entrance Stairs-Number of Steps: 4 Home Layout: Two level Home Equipment: Shower seat      Prior Function  Level of Independence: Independent with assistive device(s)         Comments: Pt states that he mostly furniture cruises and that he has a [facility] which he does not use.  Pt is able to drive and aquires groceries through [employer] go services.     Hand Dominance   Dominant Hand: Right    Extremity/Trunk Assessment   Upper Extremity Assessment Upper Extremity Assessment: Overall WFL for tasks assessed    Lower Extremity Assessment Lower Extremity Assessment: Overall WFL for tasks assessed(Pt tested overall 4+/5 with the exception of B knee flexion: 4/5 and hip IR: L: 4-/5 with slight "twinge" of pain )     Cervical / Trunk Assessment Cervical / Trunk Assessment: Kyphotic(Pt maintains a flexed posture and PT educated pt concerning increased fall risk with this tpe of posture and benefit of using an AD to for fall prevention.  PT educated pt concerning use of SPC and adjusting to appropriate height.  )  Communication   Communication: No difficulties  Cognition Arousal/Alertness: Awake/alert Behavior During Therapy: WFL for tasks assessed/performed Overall Cognitive Status: Within Functional Limits for tasks assessed                                        General Comments      Exercises Other Exercises Other Exercises: Repeated lumbar extension, flexion and extension; pt responded with incresed pain with both Other Exercises: Figure 4 stretch/test: L: + 1/10 pain, R: - Other Exercises: Bridge: pt is able to perform 50% of range and reports 3/10 pain   Assessment/Plan    PT Assessment Patient needs continued PT services  PT Problem List Decreased strength;Decreased range of motion;Decreased activity tolerance;Decreased balance;Decreased mobility;Decreased knowledge of use of DME;Decreased safety awareness;Pain       PT Treatment Interventions DME instruction;Gait training;Stair training;Functional mobility training;Therapeutic activities;Therapeutic exercise;Balance training;Patient/family education    PT Goals (Current goals can be found in the Care Plan section)  Acute Rehab PT Goals Patient Stated Goal: To return home and start outpt PT. PT Goal Formulation: With patient Time For Goal Achievement: 07/23/17 Potential to Achieve Goals: Good    Frequency Min 2X/week   Barriers to discharge        Co-evaluation               AM-PAC PT "6 Clicks" Daily Activity  Outcome Measure Difficulty turning over in bed (including adjusting bedclothes, sheets and blankets)?: A Little Difficulty moving from lying on back to sitting on the side of the bed? : A  Little Difficulty sitting down on and standing up from a chair with arms (e.g., wheelchair, bedside commode, etc,.)?: A Little Help needed moving to and from a bed to chair (including a wheelchair)?: A Little Help needed walking in hospital room?: A Little Help needed climbing 3-5 steps with a railing? : A Little 6 Click Score: 18    End of Session Equipment Utilized During Treatment: Gait belt Activity Tolerance: Patient limited by pain Patient left: in bed;with call bell/phone within reach;with bed alarm set   PT Visit Diagnosis: Other abnormalities of gait and mobility (R26.89);Muscle weakness (generalized) (M62.81);Pain;Difficulty in walking, not elsewhere classified (R26.2) Pain - Right/Left: Left Pain - part of body: Hip(Low back)    Time: 7408-1448 PT Time Calculation (min) (ACUTE ONLY): 35 min   Charges:   PT Evaluation $PT Eval Low Complexity: 1 Low PT  Treatments $Therapeutic Exercise: 8-22 mins   PT G Codes:   PT G-Codes **NOT FOR INPATIENT CLASS** Functional Assessment Tool Used: AM-PAC 6 Clicks Basic Mobility Functional Limitation: Mobility: Walking and moving around Mobility: Walking and Moving Around Current Status (M1040): At least 40 percent but less than 60 percent impaired, limited or restricted Mobility: Walking and Moving Around Goal Status [phone]): At least 1 percent but less than 20 percent impaired, limited or restricted    Roxanne Gates, PT, DPT   Roxanne Gates 07/09/2017, 10:54 AM

## 2017-07-10 NOTE — Discharge Summary (Signed)
[name] [place] NAME: Keith Bradley    MR#:  [id number]  DATE OF BIRTH:  1946-01-07  DATE OF ADMISSION:  07/07/2017 ADMITTING PHYSICIAN: [name], MD  DATE OF DISCHARGE: 07/09/2017  2:32 PM  PRIMARY CARE PHYSICIAN: Glendon Axe, MD    ADMISSION DIAGNOSIS:  Intractable low back pain [M54.5] Chronic left-sided low back pain with left-sided sciatica [M54.42, G89.29]  DISCHARGE DIAGNOSIS:  Active Problems:   Back pain   SECONDARY DIAGNOSIS:   Past Medical History:  Diagnosis Date  . Hypertension     HOSPITAL COURSE:   1.  Lumbar radiculopathy.  MRI showing disc bulges L3-L4 and L4 and L5.  The patient was given systemic steroids.  Patient was seen by Dr. Cari Caraway neurosurgery.  Patient was set up with Dr. Sharlet Salina for a injection in the back which helped the patient tremendously.  Patient did not want any pain medications.  Physical therapy recommended outpatient PT. 2.  Essential hypertension on losartan and metoprolol 3.  History of CAD.  Can go back on aspirin and Plavix.  Already on metoprolol 4.  Hypertriglyceridemia.  On fenofibrate.  DISCHARGE CONDITIONS:   Satisfactory  CONSULTS OBTAINED:  Treatment Team:  Meade Maw, MD  DRUG ALLERGIES:   Allergies  Allergen Reactions  . Ace Inhibitors Swelling    DISCHARGE MEDICATIONS:   Allergies as of 07/09/2017      Reactions   Ace Inhibitors Swelling      Medication List    TAKE these medications   amLODipine 5 MG tablet Commonly known as:  NORVASC Take 5 mg by mouth daily.   aspirin EC 81 MG tablet Take 81 mg by mouth daily.   clopidogrel 75 MG tablet Commonly known as:  PLAVIX Take 75 mg by mouth daily.   fenofibrate 160 MG tablet Take 160 mg by mouth daily.   gabapentin 100 MG capsule Commonly known as:  NEURONTIN Take 100 mg by mouth 3 (three) times daily.   losartan 25 MG tablet Commonly known as:  COZAAR Take 25 mg by mouth daily.    metoprolol tartrate 100 MG tablet Commonly known as:  LOPRESSOR Take 100 mg by mouth 2 (two) times daily.   predniSONE 10 MG tablet Commonly known as:  DELTASONE 2 tablets day1; 1 tab po day2,3; 1/2 tab po day4,5   REPATHA 140 MG/ML Sosy Generic drug:  Evolocumab Inject 140 mg into the skin every 14 (fourteen) days.        DISCHARGE INSTRUCTIONS:   Follow-up PMD 1 week.  If you experience worsening of your admission symptoms, develop shortness of breath, life threatening emergency, suicidal or homicidal thoughts you must seek medical attention immediately by calling 911 or calling your MD immediately  if symptoms less severe.  You Must read complete instructions/literature along with all the possible adverse reactions/side effects for all the Medicines you take and that have been prescribed to you. Take any new Medicines after you have completely understood and accept all the possible adverse reactions/side effects.   Please note  You were cared for by a hospitalist during your hospital stay. If you have any questions about your discharge medications or the care you received while you were in the hospital after you are discharged, you can call the unit and asked to speak with the hospitalist on call if the hospitalist that took care of you is not available. Once you are discharged, your primary care physician will handle any further medical  issues. Please note that NO REFILLS for any discharge medications will be authorized once you are discharged, as it is imperative that you return to your primary care physician (or establish a relationship with a primary care physician if you do not have one) for your aftercare needs so that they can reassess your need for medications and monitor your lab values.    Today   CHIEF COMPLAINT:   Chief Complaint  Patient presents with  . Back Pain    lower    HISTORY OF PRESENT ILLNESS:  Keith Bradley  is a 72 y.o. male with came in with low  back pain   VITAL SIGNS:  Blood pressure 139/63, pulse 70, temperature 98.2 F (36.8 C), temperature source Oral, resp. rate 18, height 5\' 9"  (1.753 m), weight 83.4 kg (183 lb 12.8 oz), SpO2 93 %.    PHYSICAL EXAMINATION:  GENERAL:  72 y.o.-year-old patient lying in the bed with no acute distress.  EYES: Pupils equal, round, reactive to light and accommodation. No scleral icterus. Extraocular muscles intact.  HEENT: Head atraumatic, normocephalic. Oropharynx and nasopharynx clear.  NECK:  Supple, no jugular venous distention. No thyroid enlargement, no tenderness.  LUNGS: Normal breath sounds bilaterally, no wheezing, rales,rhonchi or crepitation. No use of accessory muscles of respiration.  CARDIOVASCULAR: S1, S2 normal. No murmurs, rubs, or gallops.  ABDOMEN: Soft, non-tender, non-distended. Bowel sounds present. No organomegaly or mass.  EXTREMITIES: No pedal edema, cyanosis, or clubbing.  NEUROLOGIC: Cranial nerves II through XII are intact. Muscle strength 5/5 in all extremities. Sensation intact. Gait not checked.  PSYCHIATRIC: The patient is alert and oriented x 3.  SKIN: No obvious rash, lesion, or ulcer.   DATA REVIEW:   CBC Recent Labs  Lab 07/07/17 1936  WBC 9.7  HGB 17.6  HCT 51.0  PLT 272    Chemistries  Recent Labs  Lab 07/07/17 1936  CREATININE 1.22      Management plans discussed with the patient, family and they are in agreement.  CODE STATUS:  Code Status History    Date Active Date Inactive Code Status Order ID Comments User Context   07/07/2017 18:39 07/09/2017 17:32 Full Code [id number]  [name], MD Inpatient      TOTAL TIME TAKING CARE OF THIS PATIENT: 35 minutes.    Loletha Grayer M.D on 07/10/2017 at 7:08 AM  Between 7am to 6pm - Pager - [phone]  After 6pm go to [email or web address] - password [email or web address] [facility]  [phone]  CC: Primary care physician; Glendon Axe, MD

## 2018-07-16 ENCOUNTER — Other Ambulatory Visit: Payer: Self-pay | Admitting: Student

## 2018-07-16 DIAGNOSIS — M7520 Bicipital tendinitis, unspecified shoulder: Secondary | ICD-10-CM

## 2018-07-16 DIAGNOSIS — M7581 Other shoulder lesions, right shoulder: Secondary | ICD-10-CM

## 2018-07-23 ENCOUNTER — Other Ambulatory Visit: Payer: Self-pay | Admitting: Student

## 2018-07-23 DIAGNOSIS — M7520 Bicipital tendinitis, unspecified shoulder: Secondary | ICD-10-CM

## 2018-07-23 DIAGNOSIS — M7582 Other shoulder lesions, left shoulder: Secondary | ICD-10-CM

## 2018-07-27 ENCOUNTER — Ambulatory Visit: Payer: Medicare Other

## 2018-08-04 ENCOUNTER — Ambulatory Visit
Admission: RE | Admit: 2018-08-04 | Discharge: 2018-08-04 | Disposition: A | Discharge status: Home / Self Care | Payer: Medicare Other | Source: Ambulatory Visit | Attending: Student | Admitting: Student

## 2018-08-04 DIAGNOSIS — M7520 Bicipital tendinitis, unspecified shoulder: Secondary | ICD-10-CM | POA: Insufficient documentation

## 2018-08-04 DIAGNOSIS — M7581 Other shoulder lesions, right shoulder: Secondary | ICD-10-CM

## 2018-08-04 DIAGNOSIS — M7582 Other shoulder lesions, left shoulder: Secondary | ICD-10-CM

## 2019-02-01 IMAGING — MR MR LUMBAR SPINE W/O CM
4 of 5 series · 18 of 48 positions shown · non-contrast
Comparison: Lumbar spine MRI 06/08/2017

CLINICAL DATA: Low back pain radiating to the left lower extremity

EXAM:
MRI LUMBAR SPINE WITHOUT CONTRAST
TECHNIQUE: Multiplanar, multisequence MR imaging of the lumbar spine was
performed. No intravenous contrast was administered.

[Series 2: T2 · sagittal · 4.0mm · 0.55mm/px · 6 of 17 slices shown (1 of 2)]
[im 1/17]
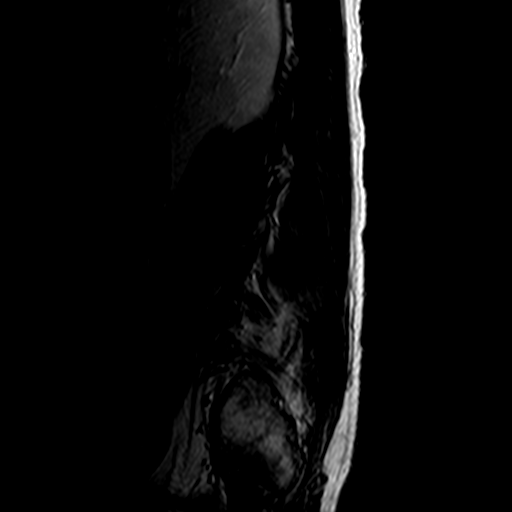
[im 4/17]
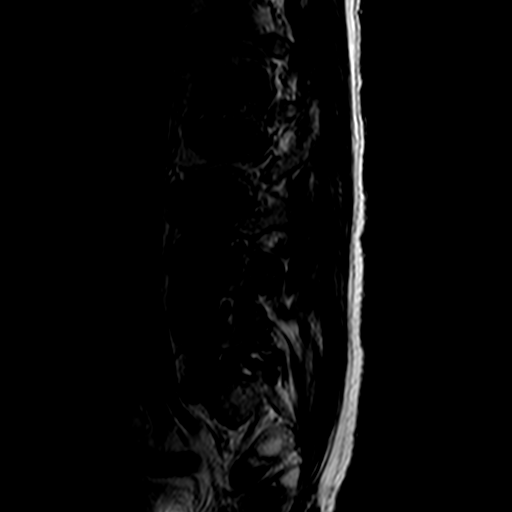
[im 7/17]
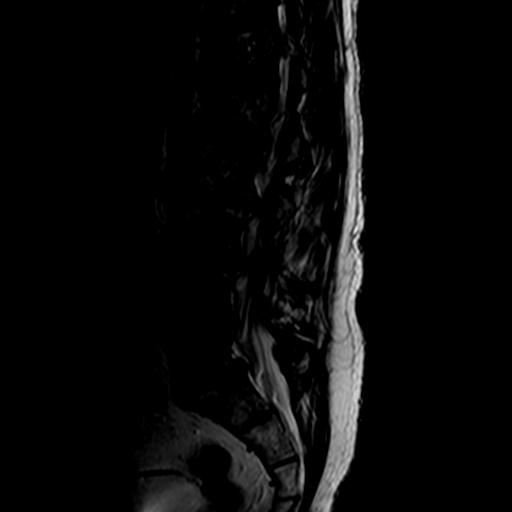
[im 10/17]
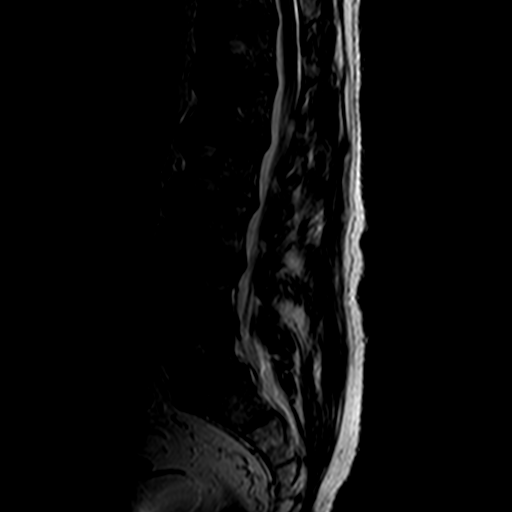
[im 13/17]
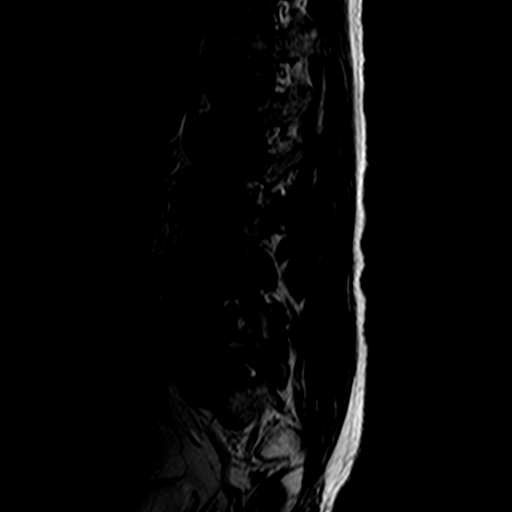
[im 17/17]
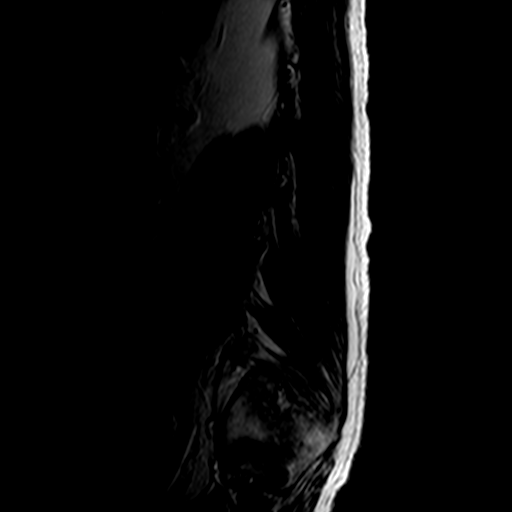

[Series 3: T1 · sagittal · 4.0mm · 0.55mm/px · 3 of 17 slices shown (1 of 2)]
[im 4/17]
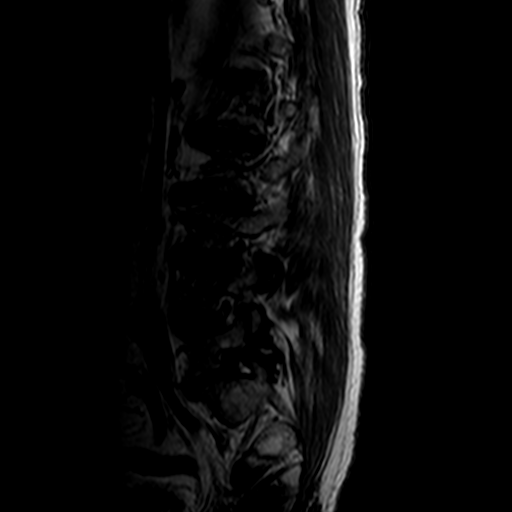
[im 10/17]
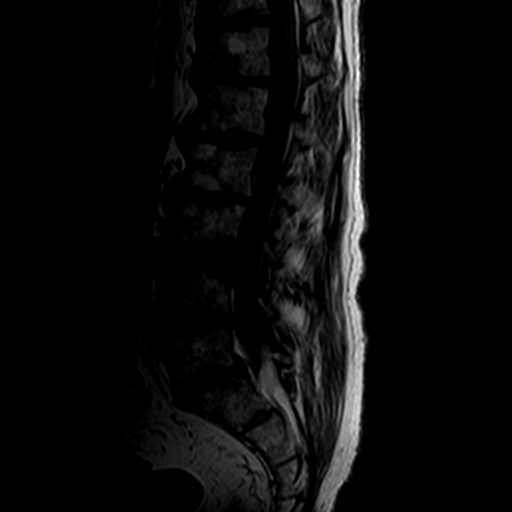
[im 17/17]
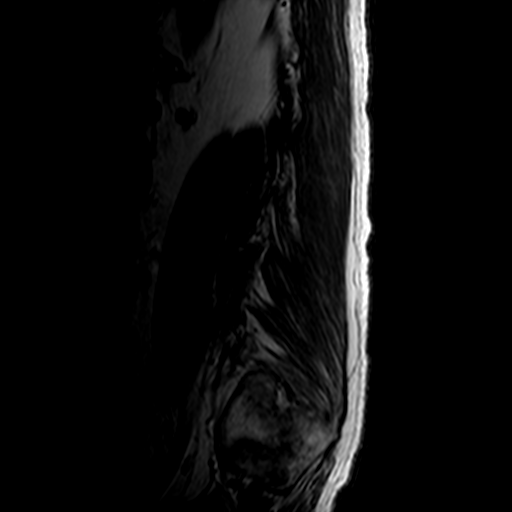

[Series 5: T2 · axial · 4.0mm · 0.39mm/px · z∈[-204,-21]mm · 6 of 40 slices shown (2 of 2)]
[im 1/40]
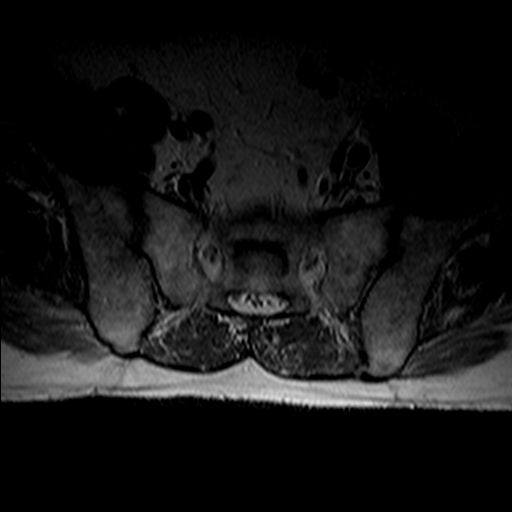
[im 6/40]
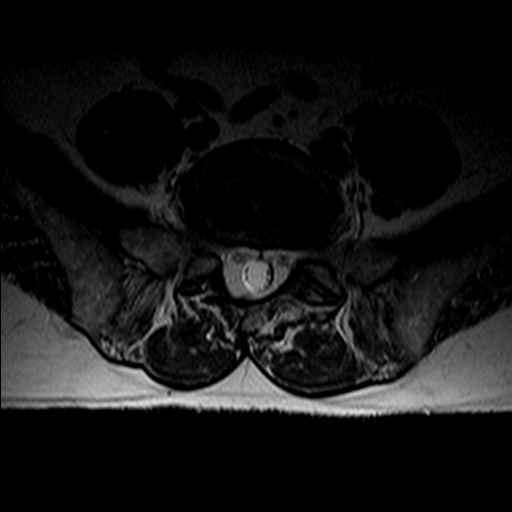
[im 12/40]
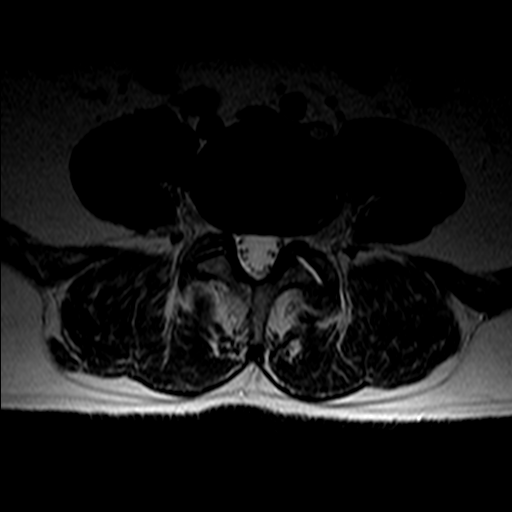
[im 17/40]
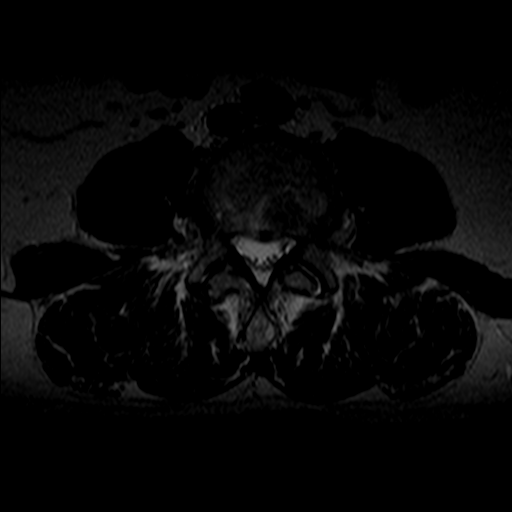
[im 20/40]
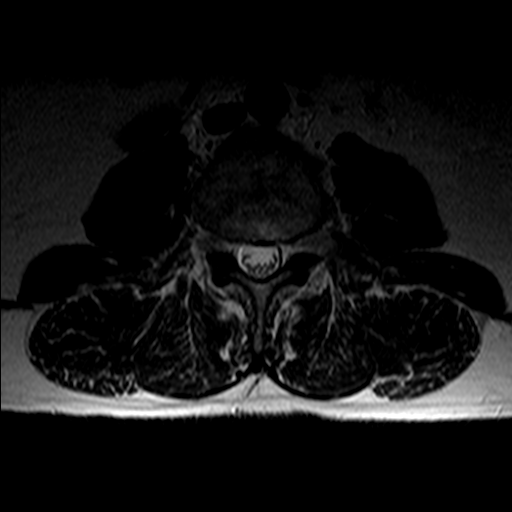
[im 34/40]
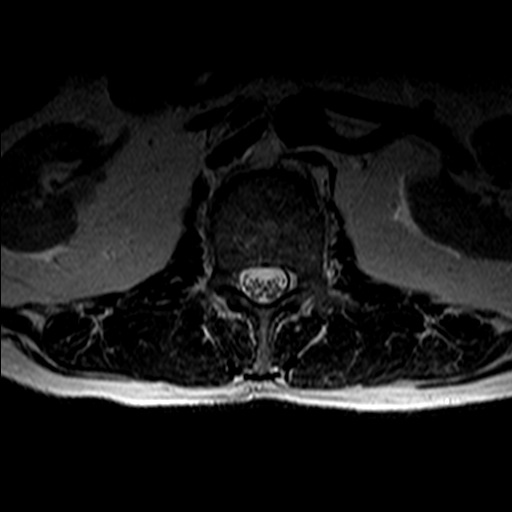

[Series 6: T1 · axial · 4.0mm · 0.39mm/px · z∈[-179,-21]mm · 3 of 40 slices shown (2 of 2)]
[im 6/40]
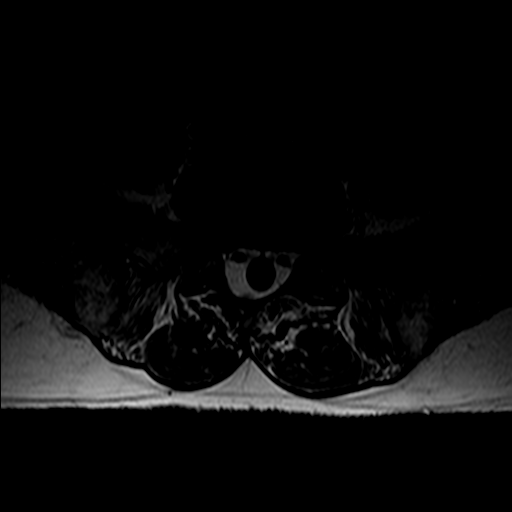
[im 20/40]
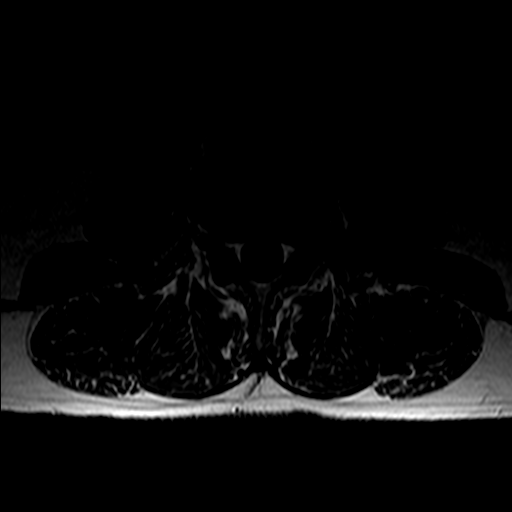
[im 34/40]
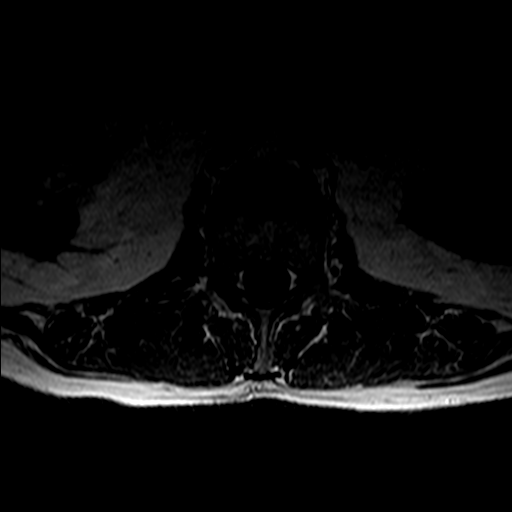

[18 of 48 positions shown; findings below may reference images not displayed]

FINDINGS: Segmentation:  Standard.

Alignment:  Physiologic.

Vertebrae: Heterogeneous bone marrow signal, unchanged. No acute
fracture or discitis-osteomyelitis.

Conus medullaris and cauda equina: Conus extends to the L1 level.
Conus and cauda equina appear normal.

Paraspinal and other soft tissues: Normal

Disc levels:

T11-T12: Evaluated on sagittal images only.  No stenosis.

T12-L1: Normal disc space and facets. No spinal canal or
neuroforaminal stenosis.

L1-L2: Normal disc space and facets. No spinal canal or
neuroforaminal stenosis.

L2-L3: Disc desiccation and mild diffuse bulge. No spinal canal
stenosis. Mild bilateral foraminal stenosis.

L3-L4: Left eccentric disc bulge, greatest in the left
extraforaminal zone, unchanged. Mild right and moderate left
foraminal stenosis. No spinal canal stenosis. Normal facets.

L4-L5: Disc desiccation with left eccentric bulge and extraforaminal
left annular fissure. No spinal canal stenosis or neural foraminal
stenosis. The left extraforaminal annular fissure is in close
proximity to the exiting L4 nerve root, but unchanged from the prior
study. Moderate left facet hypertrophy.

L5-S1: Bilobed subarticular/foraminal disc protrusions with
moderate, unchanged bilateral neural foraminal stenosis. No spinal
canal stenosis.

Visualized sacrum: Normal.
IMPRESSION: 1. L3-L4 and L4-L5 left eccentric disc bulges that are greatest in
the extraforaminal zone and are in close proximity to the exiting
left L3 and L4 nerve roots, but are unchanged compared to
06/08/2017. Moderate left L3 foraminal stenosis.
2. L5-S1 moderate bilateral foraminal stenosis.

## 2019-12-22 ENCOUNTER — Other Ambulatory Visit: Payer: Self-pay | Admitting: Nephrology

## 2019-12-22 ENCOUNTER — Other Ambulatory Visit (HOSPITAL_COMMUNITY): Payer: Self-pay | Admitting: Nephrology

## 2019-12-22 DIAGNOSIS — N1832 Chronic kidney disease, stage 3b: Secondary | ICD-10-CM

## 2019-12-22 DIAGNOSIS — E1122 Type 2 diabetes mellitus with diabetic chronic kidney disease: Secondary | ICD-10-CM

## 2019-12-22 DIAGNOSIS — N179 Acute kidney failure, unspecified: Secondary | ICD-10-CM

## 2019-12-29 ENCOUNTER — Other Ambulatory Visit: Payer: Self-pay

## 2019-12-29 ENCOUNTER — Ambulatory Visit
Admission: RE | Admit: 2019-12-29 | Discharge: 2019-12-29 | Disposition: A | Discharge status: Home / Self Care | Payer: Medicare Other | Source: Ambulatory Visit | Attending: Nephrology | Admitting: Nephrology

## 2019-12-29 DIAGNOSIS — E1122 Type 2 diabetes mellitus with diabetic chronic kidney disease: Secondary | ICD-10-CM | POA: Insufficient documentation

## 2019-12-29 DIAGNOSIS — N1832 Chronic kidney disease, stage 3b: Secondary | ICD-10-CM | POA: Diagnosis present

## 2019-12-29 DIAGNOSIS — N179 Acute kidney failure, unspecified: Secondary | ICD-10-CM | POA: Diagnosis present

## 2020-01-12 ENCOUNTER — Other Ambulatory Visit: Payer: Self-pay

## 2020-01-12 ENCOUNTER — Other Ambulatory Visit
Admission: RE | Admit: 2020-01-12 | Discharge: 2020-01-12 | Disposition: A | Discharge status: Home / Self Care | Payer: Medicare Other | Source: Ambulatory Visit | Attending: Internal Medicine | Admitting: Internal Medicine

## 2020-01-12 DIAGNOSIS — Z20822 Contact with and (suspected) exposure to covid-19: Secondary | ICD-10-CM | POA: Diagnosis not present

## 2020-01-12 DIAGNOSIS — Z01812 Encounter for preprocedural laboratory examination: Secondary | ICD-10-CM | POA: Diagnosis present

## 2020-01-13 ENCOUNTER — Encounter: Payer: Self-pay | Admitting: Internal Medicine

## 2020-01-13 LAB — SARS CORONAVIRUS 2 (TAT 6-24 HRS): SARS Coronavirus 2: NEGATIVE

## 2020-01-16 ENCOUNTER — Ambulatory Visit: Payer: Medicare Other | Admitting: Anesthesiology

## 2020-01-16 ENCOUNTER — Other Ambulatory Visit: Payer: Self-pay

## 2020-01-16 ENCOUNTER — Encounter
Admission: RE | Disposition: A | Discharge status: Home / Self Care | Payer: Self-pay | Source: Home / Self Care | Attending: Gastroenterology

## 2020-01-16 ENCOUNTER — Ambulatory Visit
Admission: RE | Admit: 2020-01-16 | Discharge: 2020-01-16 | Disposition: A | Discharge status: Home / Self Care | Payer: Medicare Other | Attending: Gastroenterology | Admitting: Gastroenterology

## 2020-01-16 ENCOUNTER — Encounter: Payer: Self-pay | Admitting: Internal Medicine

## 2020-01-16 DIAGNOSIS — Z888 Allergy status to other drugs, medicaments and biological substances status: Secondary | ICD-10-CM | POA: Insufficient documentation

## 2020-01-16 DIAGNOSIS — D124 Benign neoplasm of descending colon: Secondary | ICD-10-CM | POA: Diagnosis not present

## 2020-01-16 DIAGNOSIS — Z951 Presence of aortocoronary bypass graft: Secondary | ICD-10-CM | POA: Insufficient documentation

## 2020-01-16 DIAGNOSIS — Z1211 Encounter for screening for malignant neoplasm of colon: Secondary | ICD-10-CM | POA: Insufficient documentation

## 2020-01-16 DIAGNOSIS — I129 Hypertensive chronic kidney disease with stage 1 through stage 4 chronic kidney disease, or unspecified chronic kidney disease: Secondary | ICD-10-CM | POA: Insufficient documentation

## 2020-01-16 DIAGNOSIS — Z7902 Long term (current) use of antithrombotics/antiplatelets: Secondary | ICD-10-CM | POA: Insufficient documentation

## 2020-01-16 DIAGNOSIS — M1990 Unspecified osteoarthritis, unspecified site: Secondary | ICD-10-CM | POA: Insufficient documentation

## 2020-01-16 DIAGNOSIS — Z7984 Long term (current) use of oral hypoglycemic drugs: Secondary | ICD-10-CM | POA: Insufficient documentation

## 2020-01-16 DIAGNOSIS — N189 Chronic kidney disease, unspecified: Secondary | ICD-10-CM | POA: Diagnosis not present

## 2020-01-16 DIAGNOSIS — K648 Other hemorrhoids: Secondary | ICD-10-CM | POA: Diagnosis not present

## 2020-01-16 DIAGNOSIS — F17200 Nicotine dependence, unspecified, uncomplicated: Secondary | ICD-10-CM | POA: Diagnosis not present

## 2020-01-16 DIAGNOSIS — Z7982 Long term (current) use of aspirin: Secondary | ICD-10-CM | POA: Diagnosis not present

## 2020-01-16 DIAGNOSIS — E1122 Type 2 diabetes mellitus with diabetic chronic kidney disease: Secondary | ICD-10-CM | POA: Insufficient documentation

## 2020-01-16 DIAGNOSIS — I252 Old myocardial infarction: Secondary | ICD-10-CM | POA: Diagnosis not present

## 2020-01-16 DIAGNOSIS — Z79899 Other long term (current) drug therapy: Secondary | ICD-10-CM | POA: Diagnosis not present

## 2020-01-16 DIAGNOSIS — I2510 Atherosclerotic heart disease of native coronary artery without angina pectoris: Secondary | ICD-10-CM | POA: Diagnosis not present

## 2020-01-16 HISTORY — DX: Atherosclerotic heart disease of native coronary artery without angina pectoris: I25.10

## 2020-01-16 HISTORY — DX: Hypercalcemia: E83.52

## 2020-01-16 HISTORY — DX: Radiculopathy, lumbar region: M54.16

## 2020-01-16 HISTORY — DX: Hyperlipidemia, unspecified: E78.5

## 2020-01-16 HISTORY — PX: COLONOSCOPY WITH PROPOFOL: SHX5780

## 2020-01-16 HISTORY — DX: Type 2 diabetes mellitus without complications: E11.9

## 2020-01-16 HISTORY — DX: Unspecified osteoarthritis, unspecified site: M19.90

## 2020-01-16 HISTORY — DX: Nontoxic multinodular goiter: E04.2

## 2020-01-16 LAB — GLUCOSE, CAPILLARY: Glucose-Capillary: 151 mg/dL — ABNORMAL HIGH (ref 70–99)

## 2020-01-16 SURGERY — COLONOSCOPY WITH PROPOFOL
Anesthesia: General

## 2020-01-16 MED ORDER — PROPOFOL 500 MG/50ML IV EMUL
INTRAVENOUS | Status: AC
  Filled 2020-01-16: qty 50

## 2020-01-16 MED ORDER — SODIUM CHLORIDE 0.9 % IV SOLN: INTRAVENOUS | Status: DC

## 2020-01-16 MED ORDER — PROPOFOL 500 MG/50ML IV EMUL
INTRAVENOUS | Status: DC | PRN
  Administered 2020-01-16: 200 ug/kg/min via INTRAVENOUS

## 2020-01-16 MED ORDER — PROPOFOL 10 MG/ML IV BOLUS
INTRAVENOUS | Status: DC | PRN
  Administered 2020-01-16: 10 mg via INTRAVENOUS
  Administered 2020-01-16: 30 mg via INTRAVENOUS
  Administered 2020-01-16: 50 mg via INTRAVENOUS
  Administered 2020-01-16: 10 mg via INTRAVENOUS

## 2020-01-16 MED ORDER — LABETALOL HCL 5 MG/ML IV SOLN
INTRAVENOUS | Status: DC | PRN
  Administered 2020-01-16 (×4): 5 mg via INTRAVENOUS

## 2020-01-16 MED ORDER — LABETALOL HCL 5 MG/ML IV SOLN
INTRAVENOUS | Status: AC
  Filled 2020-01-16: qty 4

## 2020-01-16 MED ORDER — SODIUM CHLORIDE 0.9 % IV SOLN
INTRAVENOUS | Status: DC | PRN
Start: 2020-01-16 — End: 2020-01-16

## 2020-01-16 MED ORDER — LIDOCAINE HCL (PF) 2 % IJ SOLN
INTRAMUSCULAR | Status: AC
  Filled 2020-01-16: qty 5

## 2020-01-16 MED ORDER — LIDOCAINE HCL (CARDIAC) PF 100 MG/5ML IV SOSY
PREFILLED_SYRINGE | INTRAVENOUS | Status: DC | PRN
  Administered 2020-01-16: 50 mg via INTRATRACHEAL

## 2020-01-16 MED ORDER — PROPOFOL 10 MG/ML IV BOLUS
INTRAVENOUS | Status: AC
  Filled 2020-01-16: qty 20

## 2020-01-16 MED ORDER — SPOT INK MARKER SYRINGE KIT
PACK | SUBMUCOSAL | Status: DC | PRN
  Administered 2020-01-16: .5 mL via SUBMUCOSAL

## 2020-01-16 MED ORDER — LACTATED RINGERS IV SOLN
INTRAVENOUS | Status: DC | PRN
Start: 2020-01-16 — End: 2020-01-16

## 2020-01-16 NOTE — Anesthesia Preprocedure Evaluation (Addendum)
Anesthesia Evaluation  Patient identified by MRN, date of birth, ID band Patient awake    Reviewed: Allergy & Precautions, H&P , NPO status , Patient's Chart, lab work & pertinent test results  Airway Mallampati: II  TM Distance: >3 FB     Dental   Pulmonary neg COPD, Current Smoker,    Pulmonary exam normal breath sounds clear to auscultation       Cardiovascular hypertension, (-) angina+ CAD, + Past MI and + CABG (1999)   Rhythm:regular Rate:Normal     Neuro/Psych negative neurological ROS  negative psych ROS   GI/Hepatic negative GI ROS, Neg liver ROS,   Endo/Other  diabetes  Renal/GU Renal disease (CRI)  negative genitourinary   Musculoskeletal  (+) Arthritis ,   Abdominal   Peds  Hematology negative hematology ROS (+)   Anesthesia Other Findings Past Medical History: No date: Arthritis 2019: Chronic kidney disease     Comment:  chronic kidney disease No date: Coronary artery disease No date: Diabetes mellitus without complication (HCC) No date: Hypercalcemia No date: Hyperlipidemia No date: Hypertension No date: Lumbar radiculopathy No date: Multiple thyroid nodules 05/1998: Myocardial infarction ([place])     Comment:  NSTEMI  Past Surgical History: No date: cardiac bypass No date: CARDIAC CATHETERIZATION 1999: CORONARY ARTERY BYPASS GRAFT 1995: HEMORRHOID SURGERY No date: TONSILLECTOMY     Comment:  and adenoidectomy  BMI    Body Mass Index: 26.58 kg/m      Reproductive/Obstetrics negative OB ROS                           Anesthesia Physical Anesthesia Plan  ASA: III  Anesthesia Plan: General   Post-op Pain Management:    Induction:   PONV Risk Score and Plan: Propofol infusion and TIVA  Airway Management Planned: Natural Airway and Nasal Cannula  Additional Equipment:   Intra-op Plan:   Post-operative Plan:   Informed Consent: I have reviewed the  patients History and Physical, chart, labs and discussed the procedure including the risks, benefits and alternatives for the proposed anesthesia with the patient or authorized representative who has indicated his/her understanding and acceptance.     Dental Advisory Given  Plan Discussed with: Anesthesiologist, CRNA and Surgeon  Anesthesia Plan Comments:         Anesthesia Quick Evaluation

## 2020-01-16 NOTE — Interval H&P Note (Signed)
History and Physical Interval Note:  01/16/2020 9:02 AM  Keith Bradley  has presented today for surgery, with the diagnosis of COLON CANCER SCREENING.  The various methods of treatment have been discussed with the patient and family. After consideration of risks, benefits and other options for treatment, the patient has consented to  Procedure(s): COLONOSCOPY WITH PROPOFOL (N/A) as a surgical intervention.  The patient's history has been reviewed, patient examined, no change in status, stable for surgery.  I have reviewed the patient's chart and labs.  Questions were answered to the patient's satisfaction.     [name]  Ok to proceed with colonoscopy

## 2020-01-16 NOTE — Transfer of Care (Signed)
Immediate Anesthesia Transfer of Care Note  Patient: Keith Bradley  Procedure(s) Performed: COLONOSCOPY WITH PROPOFOL (N/A )  Patient Location: PACU and Endoscopy Unit  Anesthesia Type:General  Level of Consciousness: sedated  Airway & Oxygen Therapy: Patient Spontanous Breathing and Patient connected to nasal cannula oxygen  Post-op Assessment: Report given to RN and Post -op Vital signs reviewed and stable  Post vital signs: Reviewed and stable  Last Vitals:  Vitals Value Taken Time  BP 116/62 01/16/20 1006  Temp 36.6 C 01/16/20 1004  Pulse 72 01/16/20 1007  Resp 20 01/16/20 1007  SpO2 92 % 01/16/20 1007  Vitals shown include unvalidated device data.  Last Pain:  Vitals:   01/16/20 1004  TempSrc: Temporal  PainSc: Asleep         Complications: No complications documented.

## 2020-01-16 NOTE — H&P (Signed)
Outpatient short stay form Pre-procedure 01/16/2020 8:59 AM Raylene Miyamoto MD, MPH  Primary Physician: NP Gauger  Reason for visit:  Screening  History of present illness:   74 y/o gentleman here for screening colonoscopy. Last colonoscopy was 10 years ago and was normal. No family history of GI malignancies. On plavix but last took 7 days ago. No abdominal surgeries.    Current Facility-Administered Medications:  .  0.9 %  sodium chloride infusion, , Intravenous, Continuous, Carley Glendenning, Hilton Cork, MD, Last Rate: 20 mL/hr at 01/16/20 0857, New Bag at 01/16/20 0857  Medications Prior to Admission  Medication Sig Dispense Refill Last Dose  . amLODipine (NORVASC) 5 MG tablet Take 5 mg by mouth daily.   01/16/2020 at Unknown time  . aspirin EC 81 MG tablet Take 81 mg by mouth daily.   Past Week at Unknown time  . CHELATED ZINC PO Take by mouth.   Past Week at Unknown time  . cholecalciferol (VITAMIN D3) 10 MCG (400 UNIT) TABS tablet Take 1,000 Units by mouth.   Past Week at Unknown time  . Evolocumab (REPATHA) 140 MG/ML SOSY Inject 140 mg into the skin every 14 (fourteen) days.   Past Month at Unknown time  . fenofibrate 160 MG tablet Take 160 mg by mouth daily.   01/16/2020 at Unknown time  . gabapentin (NEURONTIN) 100 MG capsule Take 100 mg by mouth 2 (two) times daily.    01/16/2020 at Unknown time  . losartan (COZAAR) 25 MG tablet Take 25 mg by mouth daily.   01/16/2020 at Unknown time  . metoprolol tartrate (LOPRESSOR) 100 MG tablet Take 100 mg by mouth 2 (two) times daily.   01/16/2020 at Unknown time  . sitaGLIPtin (JANUVIA) 100 MG tablet Take 100 mg by mouth daily.   01/15/2020 at Unknown time  . Sodium Fluoride 1.1 % PSTE every other day.     . vitamin B-12 (CYANOCOBALAMIN) 1000 MCG tablet Take 1,000 mcg by mouth daily.   Past Week at Unknown time  . clopidogrel (PLAVIX) 75 MG tablet Take 75 mg by mouth daily.   01/10/2020  . predniSONE (DELTASONE) 10 MG tablet 2 tablets day1; 1 tab po  day2,3; 1/2 tab po day4,5 5 tablet 0      Allergies  Allergen Reactions  . Ace Inhibitors Swelling  . Atorvastatin Other (See Comments)    Leg Pain  . Metformin And Related Diarrhea  . Rosuvastatin Other (See Comments)    Leg Pain     Past Medical History:  Diagnosis Date  . Arthritis   . Chronic kidney disease 2019   chronic kidney disease  . Coronary artery disease   . Diabetes mellitus without complication ([place])   . Hypercalcemia   . Hyperlipidemia   . Hypertension   . Lumbar radiculopathy   . Multiple thyroid nodules   . Myocardial infarction [facility]) 05/1998   NSTEMI    Review of systems:  Otherwise negative.    Physical Exam  Gen: Alert, oriented. Appears stated age.  HEENT: [place]/AT. PERRLA. Lungs: no respiratory distress Abd: soft, benign, no masses. Ext: No edema. Pulses 2+    Planned procedures: Proceed with colonoscopy. The patient understands the nature of the planned procedure, indications, risks, alternatives and potential complications including but not limited to bleeding, infection, perforation, damage to internal organs and possible oversedation/side effects from anesthesia. The patient agrees and gives consent to proceed.  Please refer to procedure notes for findings, recommendations and patient disposition/instructions.  Raylene Miyamoto MD, MPH Gastroenterology 01/16/2020  8:59 AM

## 2020-01-16 NOTE — Op Note (Signed)
[facility] Gastroenterology Patient Name: Keith Bradley Procedure Date: 01/16/2020 8:46 AM MRN: [id number] Account #: [id number] Date of Birth: 07-31-1945 Admit Type: Outpatient Age: 74 Room: [facility] ENDO ROOM 4 Gender: Male Note Status: Finalized Procedure:             Colonoscopy Indications:           Screening for colorectal malignant neoplasm Providers:             Andrey Farmer MD, MD Referring MD:          Juluis Rainier (Referring MD) Medicines:             Monitored Anesthesia Care Complications:         No immediate complications. Estimated blood loss:                         Minimal. Procedure:             Pre-Anesthesia Assessment:                        - Prior to the procedure, a History and Physical was                         performed, and patient medications and allergies were                         reviewed. The patient is competent. The risks and                         benefits of the procedure and the sedation options and                         risks were discussed with the patient. All questions                         were answered and informed consent was obtained.                         Patient identification and proposed procedure were                         verified by the physician, the nurse, the anesthetist                         and the technician in the endoscopy suite. Mental                         Status Examination: alert and oriented. Airway                         Examination: normal oropharyngeal airway and neck                         mobility. Respiratory Examination: clear to                         auscultation. CV Examination: normal. Prophylactic  Antibiotics: The patient does not require prophylactic                         antibiotics. Prior Anticoagulants: The patient has                         taken Plavix (clopidogrel), last dose was 7 days prior                         to procedure.  ASA Grade Assessment: II - A patient                         with mild systemic disease. After reviewing the risks                         and benefits, the patient was deemed in satisfactory                         condition to undergo the procedure. The anesthesia                         plan was to use monitored anesthesia care (MAC).                         Immediately prior to administration of medications,                         the patient was re-assessed for adequacy to receive                         sedatives. The heart rate, respiratory rate, oxygen                         saturations, blood pressure, adequacy of pulmonary                         ventilation, and response to care were monitored                         throughout the procedure. The physical status of the                         patient was re-assessed after the procedure.                        After obtaining informed consent, the colonoscope was                         passed under direct vision. Throughout the procedure,                         the patient's blood pressure, pulse, and oxygen                         saturations were monitored continuously. The                         Colonoscope was introduced through the anus and  advanced to the the cecum, identified by appendiceal                         orifice and ileocecal valve. The colonoscopy was                         performed without difficulty. The patient tolerated                         the procedure well. The quality of the bowel                         preparation was adequate to identify polyps. Findings:      The perianal and digital rectal examinations were normal.      A 10 mm polyp was found in the descending colon. The polyp was       semi-pedunculated. The polyp was removed with a piecemeal technique       using a hot snare. Resection and retrieval were complete. To prevent       bleeding post-intervention, one  hemostatic clip was successfully placed.       There was no bleeding at the end of the procedure. Area was tattooed       with an injection of [place] ink about 3 cm distal to polypectomy site.       The polyp stalk base was biopsied as well and placed in seperate jar.      A 4 mm polyp was found in the descending colon distal descending colon.       The polyp was sessile. The polyp was removed with a cold snare.       Resection and retrieval were complete. Estimated blood loss was minimal.      Internal hemorrhoids were found during retroflexion. The hemorrhoids       were small.      No additional abnormalities were found on retroflexion. Impression:            - One 10 mm polyp in the descending colon, removed                         piecemeal using a hot snare. Resected and retrieved.                         Clip was placed. Tattooed.                        - One 4 mm polyp in the descending colon in the distal                         descending colon, removed with a cold snare. Resected                         and retrieved.                        - Internal hemorrhoids. Recommendation:        - Discharge patient to home.                        - Resume previous diet.                        -  Continue present medications.                        - Resume Plavix (clopidogrel) at prior dose in 2 days.                        - Await pathology results.                        - Repeat colonoscopy in 6 months to review the                         polypectomy site.                        - Return to referring physician as previously                         scheduled. Procedure Code(s):     --- Professional ---                        [id number], Colonoscopy, flexible; with removal of                         tumor(s), polyp(s), or other lesion(s) by snare                         technique                        45381, Colonoscopy, flexible; with directed submucosal                          injection(s), any substance Diagnosis Code(s):     --- Professional ---                        Z12.11, Encounter for screening for malignant neoplasm                         of colon                        K63.5, Polyp of colon                        K64.8, Other hemorrhoids CPT copyright 2019 American Medical Association. All rights reserved. The codes documented in this report are preliminary and upon coder review may  be revised to meet current compliance requirements. Andrey Farmer, MD Andrey Farmer MD, MD 01/16/2020 10:08:08 AM Number of Addenda: 0 Note Initiated On: 01/16/2020 8:46 AM Scope Withdrawal Time: 0 hours 36 minutes 51 seconds  Total Procedure Duration: 0 hours 45 minutes 32 seconds  Estimated Blood Loss:  Estimated blood loss was minimal.      [facility]

## 2020-01-17 LAB — SURGICAL PATHOLOGY

## 2020-01-17 NOTE — Anesthesia Postprocedure Evaluation (Signed)
Anesthesia Post Note  Patient: Keith Bradley  Procedure(s) Performed: COLONOSCOPY WITH PROPOFOL (N/A )  Patient location during evaluation: PACU Anesthesia Type: General Level of consciousness: awake and alert Pain management: pain level controlled Vital Signs Assessment: post-procedure vital signs reviewed and stable Respiratory status: spontaneous breathing, nonlabored ventilation and respiratory function stable Cardiovascular status: blood pressure returned to baseline and stable Postop Assessment: no apparent nausea or vomiting Anesthetic complications: no   No complications documented.   Last Vitals:  Vitals:   01/16/20 1034 01/16/20 1044  BP: (!) 130/68 (!) 122/57  Pulse: 63 50  Resp: 16 16  Temp:    SpO2: 96% 93%    Last Pain:  Vitals:   01/17/20 0807  TempSrc:   PainSc: 0-No pain                 Tera Mater

## 2020-06-04 ENCOUNTER — Other Ambulatory Visit: Payer: Self-pay | Admitting: Physical Medicine & Rehabilitation

## 2020-06-04 DIAGNOSIS — M5416 Radiculopathy, lumbar region: Secondary | ICD-10-CM

## 2020-06-07 ENCOUNTER — Other Ambulatory Visit: Payer: Self-pay

## 2020-06-07 ENCOUNTER — Ambulatory Visit
Admission: RE | Admit: 2020-06-07 | Discharge: 2020-06-07 | Disposition: A | Discharge status: Home / Self Care | Payer: Medicare Other | Source: Ambulatory Visit | Attending: Physical Medicine & Rehabilitation | Admitting: Physical Medicine & Rehabilitation

## 2020-06-07 DIAGNOSIS — M5416 Radiculopathy, lumbar region: Secondary | ICD-10-CM | POA: Diagnosis present

## 2020-06-27 ENCOUNTER — Other Ambulatory Visit: Admission: RE | Admit: 2020-06-27 | Payer: Medicare Other | Source: Ambulatory Visit

## 2020-07-20 ENCOUNTER — Other Ambulatory Visit: Admission: RE | Admit: 2020-07-20 | Payer: Medicare Other | Source: Ambulatory Visit

## 2020-07-24 ENCOUNTER — Ambulatory Visit: Admission: RE | Admit: 2020-07-24 | Payer: Medicare Other | Source: Home / Self Care

## 2020-07-24 ENCOUNTER — Encounter: Admission: RE | Payer: Self-pay | Source: Home / Self Care

## 2020-07-24 SURGERY — COLONOSCOPY WITH PROPOFOL
Anesthesia: General

## 2020-10-21 HISTORY — PX: BACK SURGERY: SHX140

## 2021-06-04 ENCOUNTER — Encounter: Payer: Self-pay | Admitting: General Surgery

## 2021-06-05 ENCOUNTER — Ambulatory Visit
Admission: RE | Admit: 2021-06-05 | Discharge: 2021-06-05 | Disposition: A | Discharge status: Home / Self Care | Payer: Medicare Other | Attending: General Surgery | Admitting: General Surgery

## 2021-06-05 ENCOUNTER — Ambulatory Visit: Payer: Medicare Other | Admitting: Registered Nurse

## 2021-06-05 ENCOUNTER — Encounter
Admission: RE | Disposition: A | Discharge status: Home / Self Care | Payer: Self-pay | Source: Home / Self Care | Attending: General Surgery

## 2021-06-05 DIAGNOSIS — I252 Old myocardial infarction: Secondary | ICD-10-CM | POA: Insufficient documentation

## 2021-06-05 DIAGNOSIS — D120 Benign neoplasm of cecum: Secondary | ICD-10-CM | POA: Diagnosis not present

## 2021-06-05 DIAGNOSIS — N189 Chronic kidney disease, unspecified: Secondary | ICD-10-CM | POA: Diagnosis not present

## 2021-06-05 DIAGNOSIS — Z1211 Encounter for screening for malignant neoplasm of colon: Secondary | ICD-10-CM | POA: Diagnosis not present

## 2021-06-05 DIAGNOSIS — Z86010 Personal history of colonic polyps: Secondary | ICD-10-CM | POA: Insufficient documentation

## 2021-06-05 DIAGNOSIS — Z951 Presence of aortocoronary bypass graft: Secondary | ICD-10-CM | POA: Diagnosis not present

## 2021-06-05 DIAGNOSIS — E785 Hyperlipidemia, unspecified: Secondary | ICD-10-CM | POA: Insufficient documentation

## 2021-06-05 DIAGNOSIS — K621 Rectal polyp: Secondary | ICD-10-CM | POA: Insufficient documentation

## 2021-06-05 DIAGNOSIS — D123 Benign neoplasm of transverse colon: Secondary | ICD-10-CM | POA: Diagnosis not present

## 2021-06-05 DIAGNOSIS — F17210 Nicotine dependence, cigarettes, uncomplicated: Secondary | ICD-10-CM | POA: Insufficient documentation

## 2021-06-05 DIAGNOSIS — E1122 Type 2 diabetes mellitus with diabetic chronic kidney disease: Secondary | ICD-10-CM | POA: Diagnosis not present

## 2021-06-05 DIAGNOSIS — M1990 Unspecified osteoarthritis, unspecified site: Secondary | ICD-10-CM | POA: Insufficient documentation

## 2021-06-05 DIAGNOSIS — G709 Myoneural disorder, unspecified: Secondary | ICD-10-CM | POA: Diagnosis not present

## 2021-06-05 DIAGNOSIS — I129 Hypertensive chronic kidney disease with stage 1 through stage 4 chronic kidney disease, or unspecified chronic kidney disease: Secondary | ICD-10-CM | POA: Diagnosis not present

## 2021-06-05 DIAGNOSIS — I2510 Atherosclerotic heart disease of native coronary artery without angina pectoris: Secondary | ICD-10-CM | POA: Diagnosis not present

## 2021-06-05 DIAGNOSIS — D122 Benign neoplasm of ascending colon: Secondary | ICD-10-CM | POA: Insufficient documentation

## 2021-06-05 HISTORY — PX: COLONOSCOPY WITH PROPOFOL: SHX5780

## 2021-06-05 HISTORY — DX: Gout, unspecified: M10.9

## 2021-06-05 HISTORY — DX: Epidermal cyst: L72.0

## 2021-06-05 HISTORY — DX: Vitamin D deficiency, unspecified: E55.9

## 2021-06-05 LAB — GLUCOSE, CAPILLARY: Glucose-Capillary: 117 mg/dL — ABNORMAL HIGH (ref 70–99)

## 2021-06-05 SURGERY — COLONOSCOPY WITH PROPOFOL
Anesthesia: General

## 2021-06-05 MED ORDER — LIDOCAINE HCL (CARDIAC) PF 100 MG/5ML IV SOSY
PREFILLED_SYRINGE | INTRAVENOUS | Status: DC | PRN
  Administered 2021-06-05: 40 mg via INTRAVENOUS

## 2021-06-05 MED ORDER — PROPOFOL 500 MG/50ML IV EMUL
INTRAVENOUS | Status: AC
  Filled 2021-06-05: qty 50

## 2021-06-05 MED ORDER — PROPOFOL 10 MG/ML IV BOLUS
INTRAVENOUS | Status: DC | PRN
  Administered 2021-06-05: 90 mg via INTRAVENOUS

## 2021-06-05 MED ORDER — PROPOFOL 500 MG/50ML IV EMUL
INTRAVENOUS | Status: DC | PRN
  Administered 2021-06-05: 150 ug/kg/min via INTRAVENOUS

## 2021-06-05 MED ORDER — SODIUM CHLORIDE 0.9 % IV SOLN
INTRAVENOUS | Status: DC
  Administered 2021-06-05: 09:00:00 20 mL/h via INTRAVENOUS

## 2021-06-05 NOTE — Op Note (Signed)
[facility] Gastroenterology Patient Name: Keith Bradley Procedure Date: 06/05/2021 8:08 AM MRN: [id number] Account #: [id number] Date of Birth: Nov 16, 1945 Admit Type: Outpatient Age: 75 Room: [facility] ENDO ROOM 1 Gender: Male Note Status: Finalized Instrument Name: Peds Colonoscope [id number] Procedure:             Colonoscopy Indications:           High risk colon cancer surveillance: Personal history                         of colonic polyps Providers:             Robert Bellow, MD Referring MD:          Juluis Rainier (Referring MD) Medicines:             Propofol per Anesthesia Complications:         No immediate complications. Procedure:             Pre-Anesthesia Assessment:                        - Prior to the procedure, a History and Physical was                         performed, and patient medications, allergies and                         sensitivities were reviewed. The patient's tolerance                         of previous anesthesia was reviewed.                        - The risks and benefits of the procedure and the                         sedation options and risks were discussed with the                         patient. All questions were answered and informed                         consent was obtained.                        After obtaining informed consent, the colonoscope was                         passed under direct vision. Throughout the procedure,                         the patient's blood pressure, pulse, and oxygen                         saturations were monitored continuously. The                         Colonoscope was introduced through the anus and                         advanced  to the the cecum, identified by appendiceal                         orifice and ileocecal valve. The colonoscopy was                         performed without difficulty. The patient tolerated                         the procedure well. The quality  of the bowel                         preparation was excellent. Findings:      Four sessile polyps were found in the rectum, hepatic flexure, ascending       colon and cecum. The polyps were 5 to 9 mm in size. These polyps were       removed with a hot snare. Resection and retrieval were complete.      The retroflexed view of the distal rectum and anal verge was normal and       showed no anal or rectal abnormalities. Impression:            - Four 5 to 9 mm polyps in the rectum, at the hepatic                         flexure, in the ascending colon and in the cecum,                         removed with a hot snare. Resected and retrieved.                        - The distal rectum and anal verge are normal on                         retroflexion view. Recommendation:        - Discharge patient to home (via wheelchair).                        - Telephone endoscopist for pathology results in 1                         week. Procedure Code(s):     --- Professional ---                        [phone], Colonoscopy, flexible; with removal of                         tumor(s), polyp(s), or other lesion(s) by snare                         technique Diagnosis Code(s):     --- Professional ---                        Z86.010, Personal history of colonic polyps                        K62.1, Rectal polyp  K63.5, Polyp of colon CPT copyright 2019 American Medical Association. All rights reserved. The codes documented in this report are preliminary and upon coder review may  be revised to meet current compliance requirements. Robert Bellow, MD 06/05/2021 9:48:55 AM This report has been signed electronically. Number of Addenda: 0 Note Initiated On: 06/05/2021 8:08 AM Scope Withdrawal Time: 0 hours 22 minutes 18 seconds  Total Procedure Duration: 0 hours 33 minutes 40 seconds  Estimated Blood Loss:  Estimated blood loss was minimal.      [facility]

## 2021-06-05 NOTE — Transfer of Care (Signed)
Immediate Anesthesia Transfer of Care Note  Patient: Keith Bradley  Procedure(s) Performed: Procedure(s) with comments: COLONOSCOPY WITH PROPOFOL (N/A) - DM  Patient Location: PACU and Endoscopy Unit  Anesthesia Type:General  Level of Consciousness: sedated  Airway & Oxygen Therapy: Patient Spontanous Breathing and Patient connected to nasal cannula oxygen  Post-op Assessment: Report given to RN and Post -op Vital signs reviewed and stable  Post vital signs: Reviewed and stable  Last Vitals:  Vitals:   06/05/21 0818 06/05/21 0950  BP: (!) 155/91   Pulse: 61   Resp: 20   Temp: (!) 35.8 C (!) 35.6 C  SpO2: 75%     Complications: No apparent anesthesia complications

## 2021-06-05 NOTE — H&P (Signed)
Keith Bradley [id number] [birth year]     HPI: Pedunculated tubular adenoma removed from the descending colon in October 2021 during a screening exam completed by Dr. Haig Bradley. Follow up study recommended.  Tolerated prep well.    Recent 2 level micro-dissectomy by Keith Bradley in [place] successful with relief of pain.   Medications Prior to Admission  Medication Sig Dispense Refill Last Dose   amLODipine (NORVASC) 5 MG tablet Take 5 mg by mouth daily.   06/05/2021 at 0700   aspirin EC 81 MG tablet Take 81 mg by mouth daily.   06/04/2021   CHELATED ZINC PO Take by mouth.   06/04/2021   cholecalciferol (VITAMIN D3) 10 MCG (400 UNIT) TABS tablet Take 1,000 Units by mouth.   06/05/2021 at 0700   Evolocumab 140 MG/ML SOSY Inject 140 mg into the skin every 14 (fourteen) days.   06/04/2021   fenofibrate 160 MG tablet Take 160 mg by mouth daily.   06/04/2021   gabapentin (NEURONTIN) 100 MG capsule Take 100 mg by mouth 2 (two) times daily.    06/04/2021   losartan (COZAAR) 25 MG tablet Take 25 mg by mouth daily.   06/05/2021 at 0700   metoprolol tartrate (LOPRESSOR) 100 MG tablet Take 100 mg by mouth 2 (two) times daily.   06/05/2021 at 0700   sitaGLIPtin (JANUVIA) 100 MG tablet Take 100 mg by mouth daily.   06/04/2021   Sodium Fluoride 1.1 % PSTE every other day.   06/04/2021   vitamin B-12 (CYANOCOBALAMIN) 1000 MCG tablet Take 1,000 mcg by mouth daily.   06/04/2021   clopidogrel (PLAVIX) 75 MG tablet Take 75 mg by mouth daily. (Patient not taking: Reported on 06/05/2021)   Completed Course   predniSONE (DELTASONE) 10 MG tablet 2 tablets day1; 1 tab po day2,3; 1/2 tab po day4,5 5 tablet 0    Allergies  Allergen Reactions   Ace Inhibitors Swelling   Atorvastatin Other (See Comments)    Leg Pain   Metformin And Related Diarrhea   Rosuvastatin Other (See Comments)    Leg Pain   Past Medical History:  Diagnosis Date   Arthritis    Chronic kidney disease 2019   chronic kidney disease    Coronary artery disease    Diabetes mellitus without complication (HCC)    Epidermal cyst    x2 back   Hypercalcemia    Hyperlipidemia    Hypertension    Lumbar radiculopathy    Multiple thyroid nodules    Myocardial infarction ([place]) 05/1998   NSTEMI   Polyarticular gout    Vitamin D deficiency    Past Surgical History:  Procedure Laterality Date   BACK SURGERY  10/2020   microdiscectomy lumbar   cardiac bypass     CARDIAC CATHETERIZATION     COLONOSCOPY WITH PROPOFOL N/A 01/16/2020   Procedure: COLONOSCOPY WITH PROPOFOL;  Surgeon: Keith Rubenstein, MD;  Location: [facility] ENDOSCOPY;  Service: Gastroenterology;  Laterality: N/A;   CORONARY ARTERY BYPASS GRAFT  1999   epidermal cyst removal     HEMORRHOID SURGERY  1995   LUMBAR DISC SURGERY     percutaneous biopsy thyroid     TONSILLECTOMY     and adenoidectomy   Social History   Socioeconomic History   Marital status: Divorced    Spouse name: Not on file   Number of children: Not on file   Years of education: Not on file   Highest education level: Not on file  Occupational History  Not on file  Tobacco Use   Smoking status: Every Day    Packs/day: 0.75    Types: Cigarettes   Smokeless tobacco: Never  Substance and Sexual Activity   Alcohol use: Yes    Alcohol/week: 14.0 standard drinks    Types: 14 Standard drinks or equivalent per week   Drug use: Never   Sexual activity: Not on file  Other Topics Concern   Not on file  Social History Narrative   Not on file   Social Determinants of Health   Financial Resource Strain: Not on file  Food Insecurity: Not on file  Transportation Needs: Not on file  Physical Activity: Not on file  Stress: Not on file  Social Connections: Not on file  Intimate Partner Violence: Not on file   Social History   Social History Narrative   Not on file     ROS: Negative.     PE: HEENT: Negative. Lungs: Clear. Cardio: RR.   Assessment/Plan:  Proceed with  planned endoscopy.  Keith Bradley [facility] 06/05/2021

## 2021-06-05 NOTE — Anesthesia Preprocedure Evaluation (Signed)
Anesthesia Evaluation  Patient identified by MRN, date of birth, ID band Patient awake    Reviewed: Allergy & Precautions, H&P , NPO status , Patient's Chart, lab work & pertinent test results  History of Anesthesia Complications Negative for: history of anesthetic complications  Airway Mallampati: III  TM Distance: >3 FB Neck ROM: Full    Dental  (+) Teeth Intact   Pulmonary neg sleep apnea, neg COPD, Current SmokerPatient did not abstain from smoking.,    Pulmonary exam normal breath sounds clear to auscultation       Cardiovascular Exercise Tolerance: Good METShypertension, (-) angina+ CAD, + Past MI and + CABG (1999)  (-) dysrhythmias  Rhythm:regular Rate:Normal  Lives on second floor, no issues walking up stairs   Neuro/Psych  Neuromuscular disease negative psych ROS   GI/Hepatic negative GI ROS, Neg liver ROS, neg GERD  ,  Endo/Other  diabetes  Renal/GU Renal disease (CRI)  negative genitourinary   Musculoskeletal  (+) Arthritis ,   Abdominal   Peds  Hematology negative hematology ROS (+)   Anesthesia Other Findings Past Medical History: No date: Arthritis 2019: Chronic kidney disease     Comment:  chronic kidney disease No date: Coronary artery disease No date: Diabetes mellitus without complication (HCC) No date: Hypercalcemia No date: Hyperlipidemia No date: Hypertension No date: Lumbar radiculopathy No date: Multiple thyroid nodules 05/1998: Myocardial infarction ([place])     Comment:  NSTEMI  Past Surgical History: No date: cardiac bypass No date: CARDIAC CATHETERIZATION 1999: CORONARY ARTERY BYPASS GRAFT 1995: HEMORRHOID SURGERY No date: TONSILLECTOMY     Comment:  and adenoidectomy  BMI    Body Mass Index: 26.58 kg/m      Reproductive/Obstetrics negative OB ROS                             Anesthesia Physical  Anesthesia Plan  ASA: 3  Anesthesia Plan:  General   Post-op Pain Management: Minimal or no pain anticipated   Induction: Intravenous  PONV Risk Score and Plan: 1 and Propofol infusion and TIVA  Airway Management Planned: Natural Airway and Nasal Cannula  Additional Equipment: None  Intra-op Plan:   Post-operative Plan:   Informed Consent: I have reviewed the patients History and Physical, chart, labs and discussed the procedure including the risks, benefits and alternatives for the proposed anesthesia with the patient or authorized representative who has indicated his/her understanding and acceptance.     Dental Advisory Given  Plan Discussed with: Anesthesiologist, CRNA and Surgeon  Anesthesia Plan Comments: (Discussed risks of anesthesia with patient, including possibility of difficulty with spontaneous ventilation under anesthesia necessitating airway intervention, PONV, and rare risks such as cardiac or respiratory or neurological events, and allergic reactions. Discussed the role of CRNA in patient's perioperative care. Patient understands.)        Anesthesia Quick Evaluation

## 2021-06-05 NOTE — Anesthesia Postprocedure Evaluation (Signed)
Anesthesia Post Note  Patient: Keith Bradley  Procedure(s) Performed: COLONOSCOPY WITH PROPOFOL  Patient location during evaluation: Endoscopy Anesthesia Type: General Level of consciousness: awake and alert Pain management: pain level controlled Vital Signs Assessment: post-procedure vital signs reviewed and stable Respiratory status: spontaneous breathing, nonlabored ventilation, respiratory function stable and patient connected to nasal cannula oxygen Cardiovascular status: blood pressure returned to baseline and stable Postop Assessment: no apparent nausea or vomiting Anesthetic complications: no   No notable events documented.   Last Vitals:  Vitals:   06/05/21 0950 06/05/21 1000  BP:  138/71  Pulse:    Resp:  17  Temp: (!) 35.6 C   SpO2:      Last Pain:  Vitals:   06/05/21 0950  TempSrc: Temporal  PainSc: Asleep                 Arita Miss

## 2021-06-06 ENCOUNTER — Encounter: Payer: Self-pay | Admitting: General Surgery

## 2021-06-06 LAB — SURGICAL PATHOLOGY

## 2021-10-24 ENCOUNTER — Other Ambulatory Visit: Payer: Self-pay | Admitting: Nephrology

## 2021-10-24 DIAGNOSIS — R918 Other nonspecific abnormal finding of lung field: Secondary | ICD-10-CM

## 2021-11-01 ENCOUNTER — Ambulatory Visit
Admission: RE | Admit: 2021-11-01 | Discharge: 2021-11-01 | Disposition: A | Discharge status: Home / Self Care | Payer: Medicare Other | Source: Ambulatory Visit | Attending: Nephrology | Admitting: Nephrology

## 2021-11-01 DIAGNOSIS — R918 Other nonspecific abnormal finding of lung field: Secondary | ICD-10-CM | POA: Insufficient documentation

## 2021-12-06 ENCOUNTER — Ambulatory Visit: Payer: Medicare Other | Admitting: Cardiology
